# Patient Record
Sex: Female | Born: 1990 | Race: White | Hispanic: No | Marital: Single | Smoking: Current every day smoker
Health system: Southern US, Community
[De-identification: ages and names within clinical notes are randomized; demographics above are authoritative.]

## PROBLEM LIST (undated history)

## (undated) DIAGNOSIS — M51369 Other intervertebral disc degeneration, lumbar region without mention of lumbar back pain or lower extremity pain: Secondary | ICD-10-CM

## (undated) DIAGNOSIS — M5136 Other intervertebral disc degeneration, lumbar region: Secondary | ICD-10-CM

## (undated) DIAGNOSIS — K5190 Ulcerative colitis, unspecified, without complications: Secondary | ICD-10-CM

## (undated) DIAGNOSIS — E282 Polycystic ovarian syndrome: Secondary | ICD-10-CM

## (undated) HISTORY — DX: Polycystic ovarian syndrome: E28.2

## (undated) HISTORY — DX: Other intervertebral disc degeneration, lumbar region: M51.36

## (undated) HISTORY — PX: TONSILLECTOMY: SUR1361

## (undated) HISTORY — DX: Other intervertebral disc degeneration, lumbar region without mention of lumbar back pain or lower extremity pain: M51.369

---

## 2018-12-21 ENCOUNTER — Telehealth: Payer: Self-pay | Admitting: *Deleted

## 2018-12-21 ENCOUNTER — Other Ambulatory Visit: Payer: Self-pay | Admitting: Adult Health

## 2018-12-21 NOTE — Telephone Encounter (Signed)
Left message letting pt know we are not allowing any visitors or children at appt and if she has come in contact with someone or if she is experiencing any symptoms of Covid-19, please call office before coming to appt. [place]

## 2019-07-14 ENCOUNTER — Other Ambulatory Visit: Payer: Self-pay | Admitting: *Deleted

## 2019-07-14 DIAGNOSIS — Z20822 Contact with and (suspected) exposure to covid-19: Secondary | ICD-10-CM

## 2019-07-15 LAB — NOVEL CORONAVIRUS, NAA: SARS-CoV-2, NAA: NOT DETECTED

## 2019-07-18 ENCOUNTER — Telehealth: Payer: Self-pay | Admitting: *Deleted

## 2019-07-18 NOTE — Telephone Encounter (Signed)
Pt called in for COVID-19 test result.   Someone had called her earlier. I let her know her test result was non detected meaning she did not have the virus.

## 2019-09-30 ENCOUNTER — Other Ambulatory Visit: Payer: Self-pay

## 2019-09-30 ENCOUNTER — Emergency Department (HOSPITAL_COMMUNITY)
Admission: EM | Admit: 2019-09-30 | Discharge: 2019-09-30 | Disposition: A | Discharge status: Home / Self Care | Payer: Commercial Managed Care - PPO | Attending: Emergency Medicine | Admitting: Emergency Medicine

## 2019-09-30 ENCOUNTER — Emergency Department (HOSPITAL_COMMUNITY): Payer: Commercial Managed Care - PPO

## 2019-09-30 ENCOUNTER — Encounter (HOSPITAL_COMMUNITY): Payer: Self-pay | Admitting: Emergency Medicine

## 2019-09-30 DIAGNOSIS — F17210 Nicotine dependence, cigarettes, uncomplicated: Secondary | ICD-10-CM | POA: Insufficient documentation

## 2019-09-30 DIAGNOSIS — Y9389 Activity, other specified: Secondary | ICD-10-CM | POA: Diagnosis not present

## 2019-09-30 DIAGNOSIS — Y990 Civilian activity done for income or pay: Secondary | ICD-10-CM | POA: Diagnosis not present

## 2019-09-30 DIAGNOSIS — S39012A Strain of muscle, fascia and tendon of lower back, initial encounter: Secondary | ICD-10-CM | POA: Diagnosis not present

## 2019-09-30 DIAGNOSIS — S3992XA Unspecified injury of lower back, initial encounter: Secondary | ICD-10-CM | POA: Diagnosis present

## 2019-09-30 DIAGNOSIS — X501XXA Overexertion from prolonged static or awkward postures, initial encounter: Secondary | ICD-10-CM | POA: Diagnosis not present

## 2019-09-30 DIAGNOSIS — Y929 Unspecified place or not applicable: Secondary | ICD-10-CM | POA: Diagnosis not present

## 2019-09-30 HISTORY — DX: Ulcerative colitis, unspecified, without complications: K51.90

## 2019-09-30 LAB — URINALYSIS, ROUTINE W REFLEX MICROSCOPIC
Bilirubin Urine: NEGATIVE
Glucose, UA: NEGATIVE mg/dL
Hgb urine dipstick: NEGATIVE
Ketones, ur: NEGATIVE mg/dL
Leukocytes,Ua: NEGATIVE
Nitrite: NEGATIVE
Protein, ur: NEGATIVE mg/dL
Specific Gravity, Urine: 1.017 (ref 1.005–1.030)
pH: 5 (ref 5.0–8.0)

## 2019-09-30 LAB — POC URINE PREG, ED: Preg Test, Ur: NEGATIVE

## 2019-09-30 IMAGING — CR DG LUMBAR SPINE COMPLETE 4+V
5 series · 5 of 5 positions shown · non-contrast
Comparison: None.

CLINICAL DATA: Back injury, unable to bear weight in right leg

EXAM:
LUMBAR SPINE - COMPLETE 4+ VIEW

[l-spine ap]
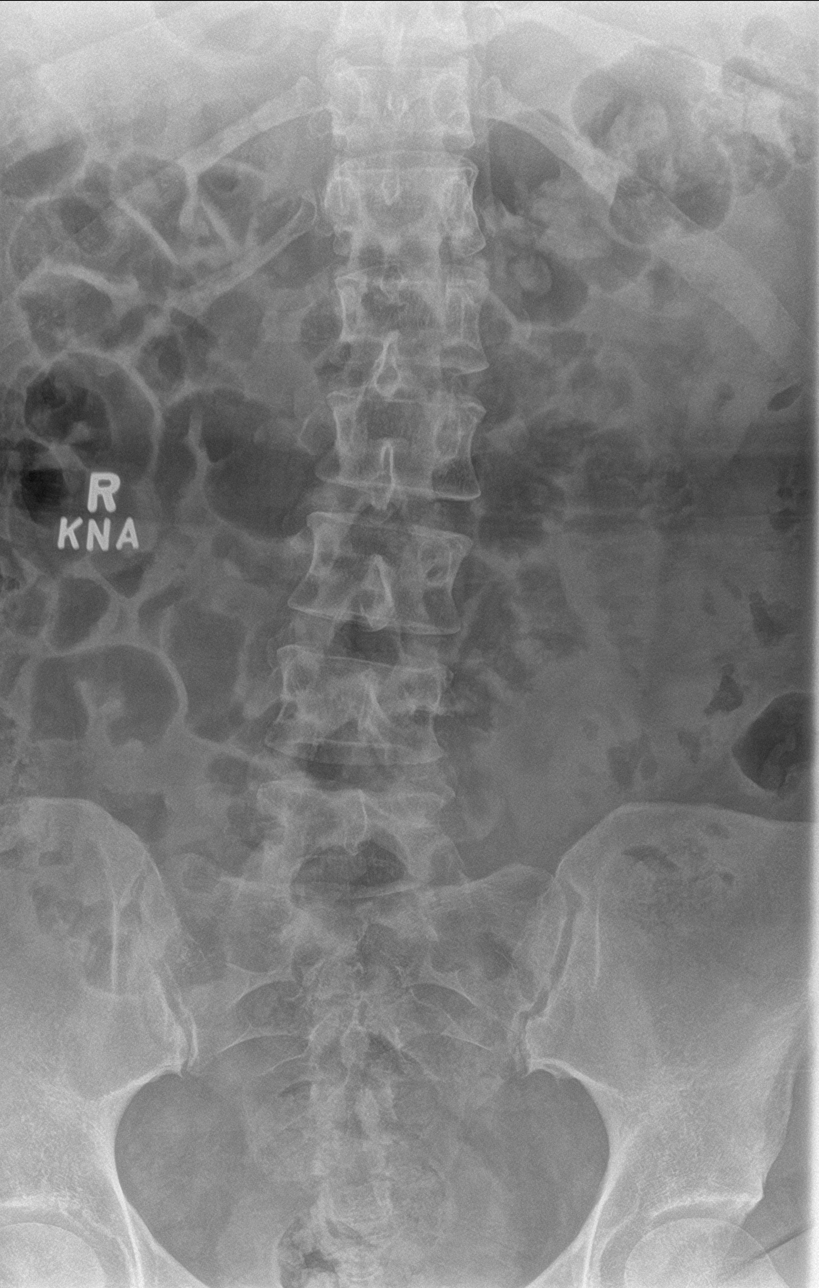

[l-spine obl (1 of 2)]
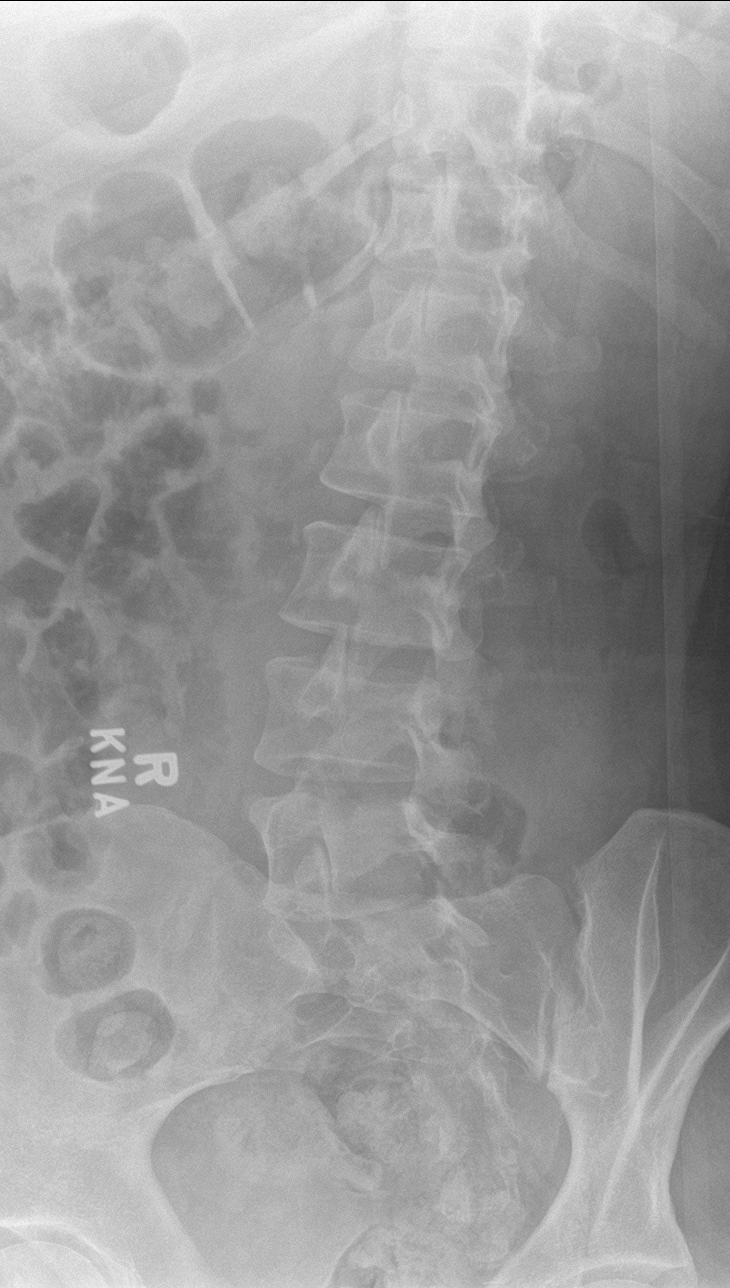

[l-spine obl (2 of 2)]
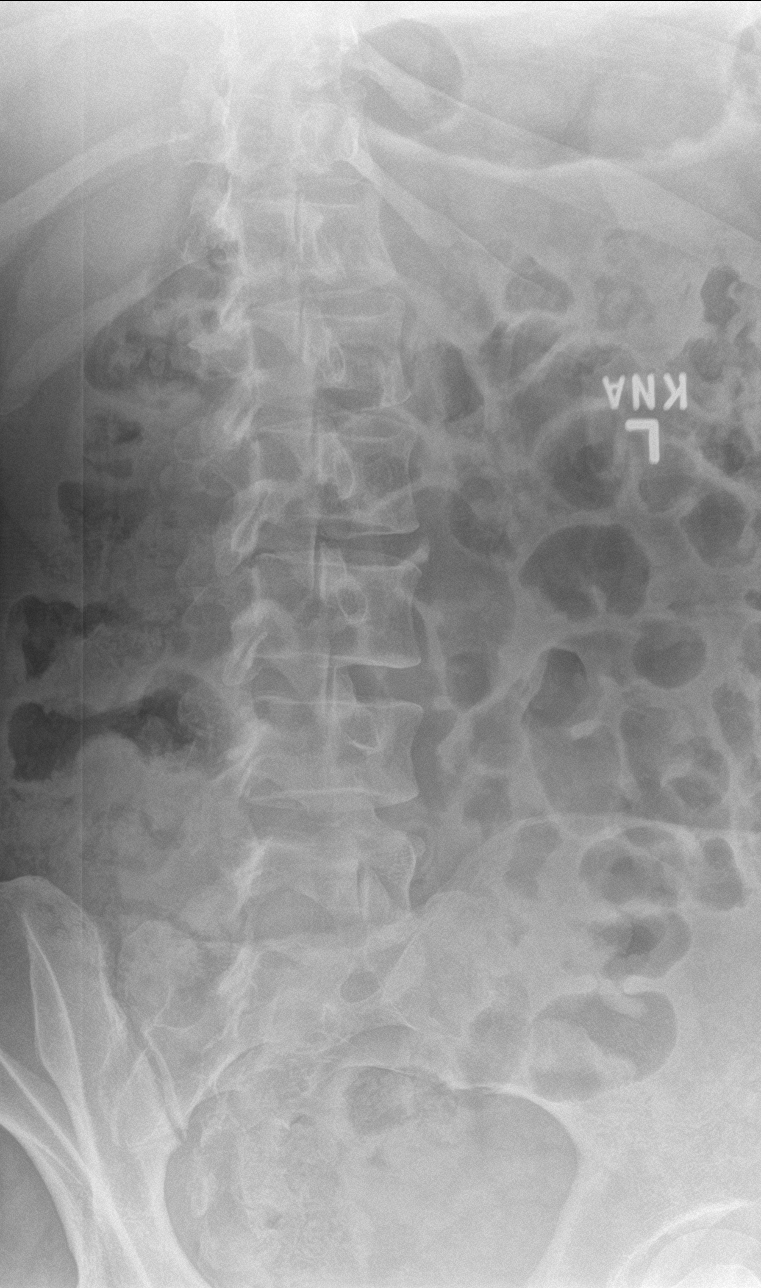

[l-spine lat]
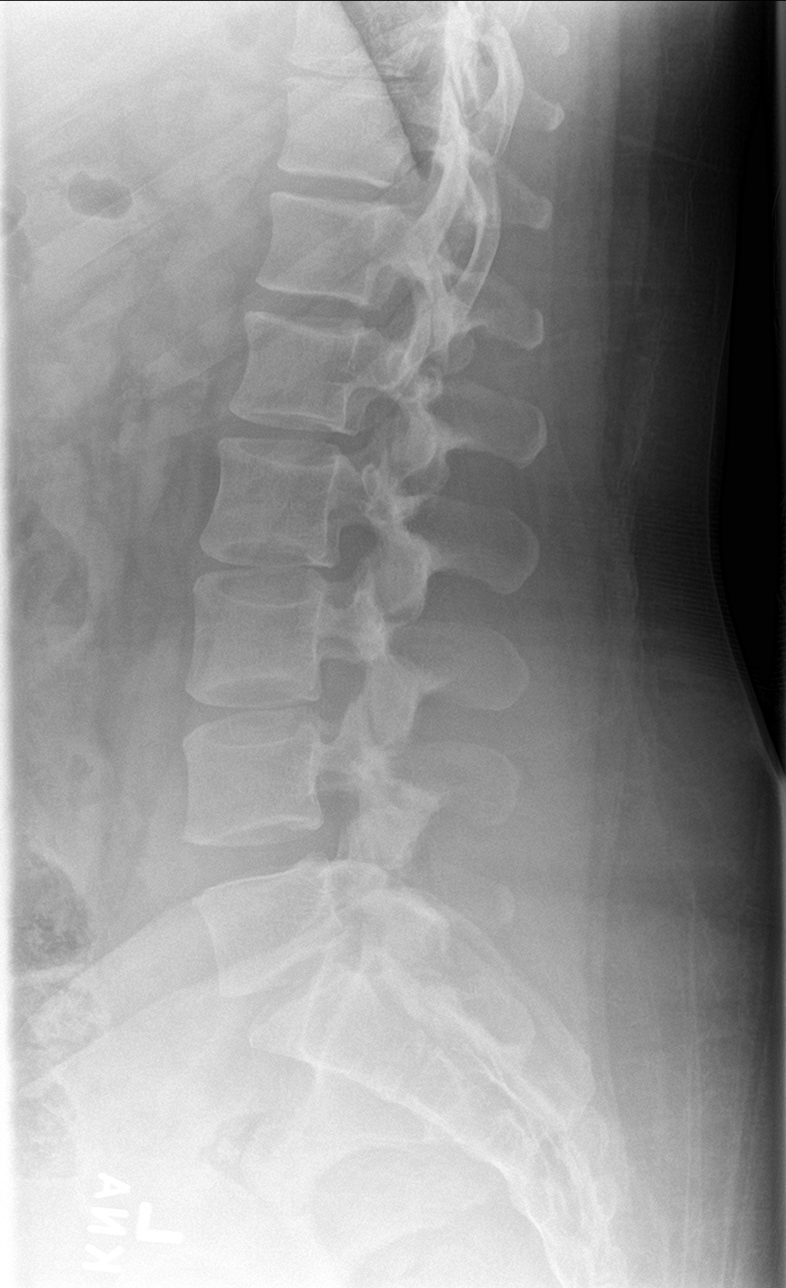

[l-spine spot]
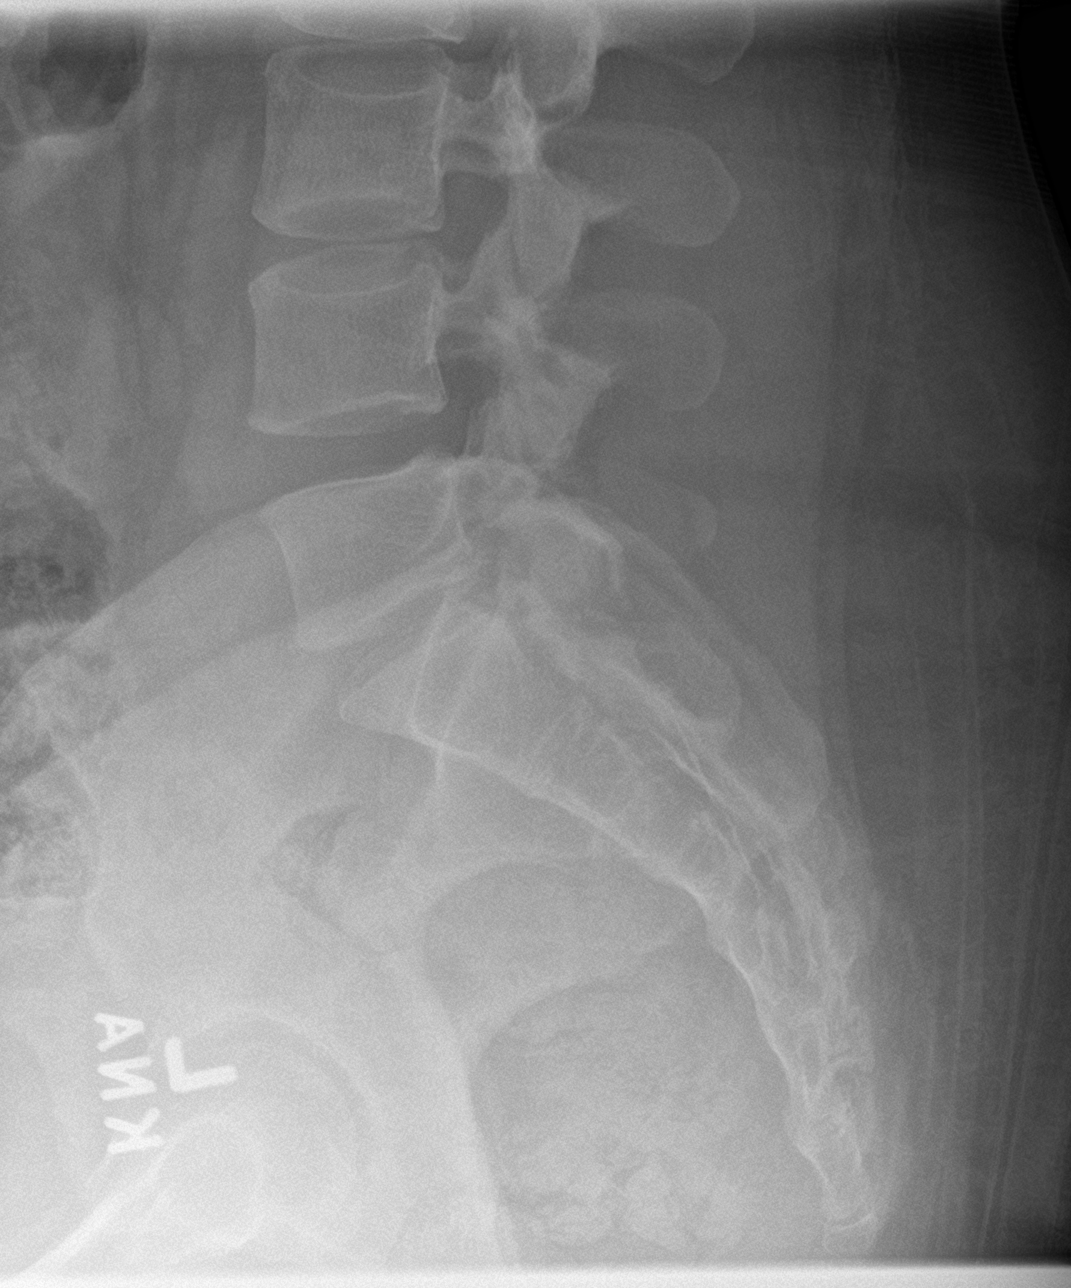

[5 of 5 positions shown; findings below may reference images not displayed]

FINDINGS: No fracture or dislocation of the lumbar spine. Disc spaces and
vertebral body heights are preserved. Gentle levoscoliosis of the
thoracolumbar spine, apex approximately L1-L2. Nonobstructive
pattern of overlying bowel gas.
IMPRESSION: No fracture or dislocation of the lumbar spine. Disc spaces and
vertebral body heights are preserved. Lumbar disc and neural
foraminal pathology may be further evaluated by MRI if indicated by
localizing signs and symptoms.

## 2019-09-30 MED ORDER — METHOCARBAMOL 500 MG PO TABS: 500.0000 mg | ORAL_TABLET | Freq: Two times a day (BID) | ORAL | 0 refills | Status: DC

## 2019-09-30 MED ORDER — OXYCODONE-ACETAMINOPHEN 5-325 MG PO TABS
1.0000 | ORAL_TABLET | Freq: Once | ORAL | Status: AC
  Administered 2019-09-30: 1 via ORAL
  Filled 2019-09-30: qty 1

## 2019-09-30 MED ORDER — DEXAMETHASONE SODIUM PHOSPHATE 10 MG/ML IJ SOLN: 8.0000 mg | Freq: Once | INTRAMUSCULAR | Status: DC

## 2019-09-30 MED ORDER — METHOCARBAMOL 500 MG PO TABS
500.0000 mg | ORAL_TABLET | Freq: Once | ORAL | Status: AC
  Administered 2019-09-30: 500 mg via ORAL
  Filled 2019-09-30: qty 1

## 2019-09-30 MED ORDER — DEXAMETHASONE SODIUM PHOSPHATE 10 MG/ML IJ SOLN
8.0000 mg | Freq: Once | INTRAMUSCULAR | Status: AC
  Administered 2019-09-30: 15:00:00 8 mg via INTRAMUSCULAR
  Filled 2019-09-30: qty 1

## 2019-09-30 MED ORDER — OXYCODONE-ACETAMINOPHEN 5-325 MG PO TABS: 1.0000 | ORAL_TABLET | Freq: Four times a day (QID) | ORAL | 0 refills | Status: DC | PRN

## 2019-09-30 NOTE — ED Triage Notes (Signed)
PA reported observing P:T ambulating

## 2019-09-30 NOTE — ED Provider Notes (Signed)
[facility] EMERGENCY DEPARTMENT Provider Note   CSN: [id number] Arrival date & time: 09/30/19  1232     History Chief Complaint  Patient presents with  . Back Pain    Kimberly Bautista is a 29 y.o. female with past medical history of ulcerative colitis, presenting to the emergency department with complaint of midline low back pain that began yesterday morning after work.  She states she believes it was because after transferring a patient.  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  Her pain is midline over the sacral region.  It is worse with twisting and certain movements as well as walking.  Sometimes twisting causes shooting pains down the back of her legs.  She denies numbness or weakness in her legs, bowel or bladder incontinence, saddle paresthesia, abdominal pain.  She is treated her symptoms with Tylenol.  She cannot take NSAIDs due to ulcerative colitis.  No previous back injuries.  The history is provided by the patient.       Past Medical History:  Diagnosis Date  . Ulcerative colitis ([place])     There are no problems to display for this patient.   History reviewed. No pertinent surgical history.   OB History   No obstetric history on file.     History reviewed. No pertinent family history.  Social History   Tobacco Use  . Smoking status: Current Every Day Smoker  . Smokeless tobacco: Never Used  Substance Use Topics  . Alcohol use: Yes  . Drug use: Never    Home Medications Prior to Admission medications   Medication Sig Start Date End Date Taking? Authorizing Provider  methocarbamol (ROBAXIN) 500 MG tablet Take 1 tablet (500 mg total) by mouth 2 (two) times daily. 09/30/19   Brynn Reznik, [place] N, PA-C  oxyCODONE-acetaminophen (PERCOCET/ROXICET) 5-325 MG tablet Take 1  tablet by mouth every 6 (six) hours as needed for severe pain. 09/30/19   Hattye Siegfried, [place] N, PA-C    Allergies    Penicillins  Review of Systems   Review of Systems  Gastrointestinal:       No bowel incontinence  Genitourinary: Negative for difficulty urinating.  Musculoskeletal: Positive for back pain.  Neurological: Negative for weakness and numbness.    Physical Exam Updated Vital Signs BP (!) 148/94 (BP Location: Right Arm)   Pulse 80   Temp 98.2 F (36.8 C) (Oral)   Resp 18   SpO2 99%   Physical Exam Vitals and nursing note reviewed.  Constitutional:      General: She is not in acute distress.    Appearance: She is well-developed. She is obese.  HENT:     Head: Normocephalic and atraumatic.  Eyes:     Conjunctiva/sclera: Conjunctivae normal.  Cardiovascular:     Rate and Rhythm: Normal rate and regular rhythm.  Pulmonary:     Effort: Pulmonary effort is normal. No respiratory distress.     Breath sounds: Normal breath sounds.  Musculoskeletal:     Comments: There is midline tenderness over the sacral region and extending to bilateral upper gluteal region.  No bony step-offs or gross deformities.  No tenderness to the L-spine.  Neurological:     Mental Status: She is alert.     Comments: Normal tone.  5/5 strength in BUE and BLE  including strong and equal grip strength and dorsiflexion/plantar flexion Sensory: Pinprick and light touch normal in BLE extremities.  Gait: normal gait and balance CV: distal pulses palpable throughout    Psychiatric:        Mood and Affect: Mood normal.        Behavior: Behavior normal.     ED Results / Procedures / Treatments   Labs (all labs ordered are listed, but only abnormal results are displayed) Labs Reviewed  URINALYSIS, ROUTINE W REFLEX MICROSCOPIC  POC URINE PREG, ED    EKG None  Radiology DG Lumbar Spine Complete  Result Date: 09/30/2019 CLINICAL DATA:  Back injury, unable to bear weight in right leg EXAM:  LUMBAR SPINE - COMPLETE 4+ VIEW COMPARISON:  None. FINDINGS: No fracture or dislocation of the lumbar spine. Disc spaces and vertebral body heights are preserved. Gentle levoscoliosis of the thoracolumbar spine, apex approximately L1-L2. Nonobstructive pattern of overlying bowel gas. IMPRESSION: No fracture or dislocation of the lumbar spine. Disc spaces and vertebral body heights are preserved. Lumbar disc and neural foraminal pathology may be further evaluated by MRI if indicated by localizing signs and symptoms. Electronically Signed   By: Eddie Candle M.D.   On: 09/30/2019 15:20    Procedures Procedures (including critical care time)  Medications Ordered in ED Medications  oxyCODONE-acetaminophen (PERCOCET/ROXICET) 5-325 MG per tablet 1 tablet (1 tablet Oral Given 09/30/19 1519)  methocarbamol (ROBAXIN) tablet 500 mg (500 mg Oral Given 09/30/19 1519)  dexamethasone (DECADRON) injection 8 mg (8 mg Intramuscular Given 09/30/19 1520)    ED Course  I have reviewed the triage vital signs and the nursing notes.  Pertinent labs & imaging results that were available during my care of the patient were reviewed by me and considered in my medical decision making (see chart for details).    MDM Rules/Calculators/A&P                      Patient with back pain after moving a patient at work the night before last, likely muscle strain.  Pain is located in the sacral region with some radiation towards the gluteal region.  Symptoms are worse with movement and walking.  No red flags or concerning symptoms of cauda equina or significant nerve impingement.  Normal neuro exam.  Patient can walk but states it is painful, worse in the right leg.  L-spine x-ray is negative with preserved disc height spaces.  Patient treated in the ED with pain medication, muscle relaxer and Decadron.  Discussed symptomatic management and outpatient follow-up if symptoms persist.  Strict return precautions discussed.  Patient is  agreeable to plan and safe for discharge.  Discussed results, findings, treatment and follow up. Patient advised of return precautions. Patient verbalized understanding and agreed with plan.  [place] Controlled Substance reporting System queried  Final Clinical Impression(s) / ED Diagnoses Final diagnoses:  Lumbosacral strain, initial encounter    Rx / DC Orders ED Discharge Orders         Ordered    oxyCODONE-acetaminophen (PERCOCET/ROXICET) 5-325 MG tablet  Every 6 hours PRN     09/30/19 1550    methocarbamol (ROBAXIN) 500 MG tablet  2 times daily     09/30/19 [place], [place] N, PA-C 09/30/19 1607    Lennice Sites, DO 10/01/19 0703

## 2019-09-30 NOTE — Discharge Instructions (Signed)
Please read instructions below.  You can take oxycodone every 6 hours as needed for severe pain.   Apply ice to your back for 20 minutes at a time.  You can also apply heat if this provides more relief.   You can take robaxin every 12 hours as needed for muscle spasm.  Be aware this medication can make you drowsy; do not take while driving or drinking alcohol.   Follow-up with your primary care provider.   Return to ER if new numbness or weakness in your legs, inability to urinate, inability to hold your bowels, or severely worsening symptoms.

## 2019-09-30 NOTE — ED Notes (Signed)
Patient transported to X-ray 

## 2019-09-30 NOTE — ED Triage Notes (Signed)
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

## 2019-09-30 NOTE — ED Notes (Signed)
Patient verbalizes understanding of discharge instructions . Opportunity for questions and answers were provided . Armband removed by staff ,Pt discharged from ED. W/C  offered at D/C  and Declined W/C at D/C and was escorted to lobby by RN.  

## 2020-04-10 ENCOUNTER — Other Ambulatory Visit: Payer: Self-pay

## 2020-04-10 ENCOUNTER — Encounter: Payer: Self-pay | Admitting: Family Medicine

## 2020-04-10 ENCOUNTER — Ambulatory Visit (INDEPENDENT_AMBULATORY_CARE_PROVIDER_SITE_OTHER): Payer: Commercial Managed Care - PPO | Admitting: Family Medicine

## 2020-04-10 DIAGNOSIS — G8929 Other chronic pain: Secondary | ICD-10-CM

## 2020-04-10 DIAGNOSIS — M545 Low back pain: Secondary | ICD-10-CM

## 2020-04-10 MED ORDER — NABUMETONE 750 MG PO TABS: 750.0000 mg | ORAL_TABLET | Freq: Two times a day (BID) | ORAL | 6 refills | Status: DC | PRN

## 2020-04-10 MED ORDER — TIZANIDINE HCL 2 MG PO TABS: 2.0000 mg | ORAL_TABLET | Freq: Four times a day (QID) | ORAL | 1 refills | Status: DC | PRN

## 2020-04-10 NOTE — Progress Notes (Signed)
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

## 2020-04-10 NOTE — Progress Notes (Signed)
   Office Visit Note   Patient: Kimberly Bautista           Date of Birth: 1991-03-29           MRN: [id number] Visit Date: 04/10/2020 Requested by: Toma Aran, PA-C [place],  [place] [id number] PCP: Toma Aran, PA-C  Subjective: Chief Complaint  Patient presents with  . Lower Back - Pain    HPI: Pain.  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  Around that time she began feeling midline lumbosacral pain with occasional radiation into her legs.  She went to the ER where x-rays were negative for acute abnormality.  She was given some medications but her pain has persisted.  Denies any bowel or bladder dysfunction..  No fevers or chills.  Pain is better when standing, worse when sitting or bending and worse when lying on her side.  Her mother has a history of lumbar disc problems.  There is no family history of rheumatologic disease.              ROS:   All other systems were reviewed and are negative.  Objective: Vital Signs: There were no vitals taken for this visit.  Physical Exam:  General:  Alert and oriented, in no acute distress. Pulm:  Breathing unlabored. Psy:  Normal mood, congruent affect. Skin: No visible rash. Low back: She is tender to palpation of both SI joints.  No other spine tenderness.  No scoliosis.  Limited active range of motion.  No pain in the sciatic notch.  Negative bilateral straight leg raise.  Lower extremity strength and reflexes are normal.  Patrick's test is equivocal.  Imaging: No results found.  Assessment & Plan: 1.  Chronic low back pain with radicular symptoms, possibly SI dysfunction vs HNP - Try PT and chiro. - Meloxicam and zanaflex. - MRI if fails to improve.     Procedures: No procedures performed  No notes on file     PMFS History: There are no problems to display for this patient.  Past Medical History:  Diagnosis Date  . Ulcerative  colitis ([place])     History reviewed. No pertinent family history.  History reviewed. No pertinent surgical history. Social History   Occupational History  . Not on file  Tobacco Use  . Smoking status: Current Every Day Smoker  . Smokeless tobacco: Never Used  Substance and Sexual Activity  . Alcohol use: Yes  . Drug use: Never  . Sexual activity: Not on file

## 2020-04-15 ENCOUNTER — Encounter: Payer: Self-pay | Admitting: Family Medicine

## 2020-04-25 ENCOUNTER — Telehealth: Payer: Self-pay

## 2020-04-25 NOTE — Telephone Encounter (Signed)
Katie with [facility] would like Rx, demographics, and last office visit faxed to [phone] attn:Katie.  Cb# [phone].  Please advise.  Thank you.

## 2020-04-26 ENCOUNTER — Telehealth: Payer: Self-pay

## 2020-04-26 NOTE — Telephone Encounter (Signed)
Faxed

## 2020-04-26 NOTE — Telephone Encounter (Signed)
[facility] would like referral for physical therapy to be faxed to [phone].  CB# [phone].  Please advise.  Thank you.

## 2020-04-26 NOTE — Telephone Encounter (Signed)
Kimberly Bautista will be faxing this shortly.

## 2020-05-29 ENCOUNTER — Telehealth: Payer: Self-pay | Admitting: Family Medicine

## 2020-05-29 NOTE — Telephone Encounter (Signed)
Called patient left message on voicemail to return call to schedule an appointment wit Dr Junius Roads  To followup back injury per [facility] request

## 2020-06-10 ENCOUNTER — Encounter: Payer: Self-pay | Admitting: Family Medicine

## 2020-06-10 ENCOUNTER — Ambulatory Visit (INDEPENDENT_AMBULATORY_CARE_PROVIDER_SITE_OTHER): Payer: Commercial Managed Care - PPO | Admitting: Family Medicine

## 2020-06-10 ENCOUNTER — Other Ambulatory Visit: Payer: Self-pay

## 2020-06-10 DIAGNOSIS — M545 Low back pain, unspecified: Secondary | ICD-10-CM

## 2020-06-10 DIAGNOSIS — G8929 Other chronic pain: Secondary | ICD-10-CM

## 2020-06-10 NOTE — Progress Notes (Signed)
   Office Visit Note   Patient: Kimberly Bautista           Date of Birth: 20-Jun-1991           MRN: [id number] Visit Date: 06/10/2020 Requested by: Toma Aran, PA-C [place],  [place] [id number] PCP: Toma Aran, PA-C  Subjective: Chief Complaint  Patient presents with  . 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 Patient states that since our last encounter, she has been compliant with physical therapy, medications, and chiropractic care, with minimal improvement. She says that the chiropractor was very uncomfortable, so she only went a few times before stopping that, however she has been very good with PT. Unfortunately, she feels she has hit a plateau with PT, and remains unable to chest compressions at work, due to significant pain with forward flexion. She has no radicular pain today, but continues to endorse intermittent right sided radiculopathy from time-to-time.  No bowel/bladder dysfunction. She is otherwise doing well.               ROS:   All other systems were reviewed and are negative.  Objective: Vital Signs: There were no vitals taken for this visit.  Physical Exam:  General:  Alert and oriented, in no acute distress. Pulm:  Breathing unlabored. Psy:  Normal mood, congruent affect. Skin:  No bruises or rashes appreciated.   BACK EXAM:  Reduced lumbar lordosis. Normal gait.  Reduced ROM with forward flexion, approximately 16 in from toe-touch due to pain. Reduced ROM in extension. Side-bending and rotation preserved.  TTP Along R SI Joint, and R lumbar paraspinal muscles.  5/5 strength throughout bilateral lower leg musculature.  2+ patellar reflexes bilaterally.  Sensation intact to light touch bilaterally.   Imaging: No results found.  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 No significant improvement with conservative measures thus far (meds, PT, chiro). Will order MRI to further evaluate for underlying disc or bony pathology.  -Patient is encouraged to continue her HEP as given by PT while awaiting MRI results.  -Will contact pending MRI results -Patient with no further questions or concerns at this time.      Procedures: No procedures performed  No notes on file     PMFS History: There are no problems to display for this patient.  Past Medical History:  Diagnosis Date  . Ulcerative colitis ([place])     History reviewed. No pertinent family history.  History reviewed. No pertinent surgical history. Social History   Occupational History  . Not on file  Tobacco Use  . Smoking status: Current Every Day Smoker  . Smokeless tobacco: Never Used  Substance and Sexual Activity  . Alcohol use: Yes  . Drug use: Never  . Sexual activity: Not on file

## 2020-06-10 NOTE — Progress Notes (Signed)
I saw and examined the patient with Dr. Elouise Munroe and agree with assessment and plan as outlined.    Ongoing right-sided LBP with sciatica despite adequate trial of PT.  Will request MRI scan.

## 2020-06-10 NOTE — Progress Notes (Signed)
Felling a little better

## 2020-06-30 ENCOUNTER — Other Ambulatory Visit: Payer: Commercial Managed Care - PPO

## 2020-07-26 ENCOUNTER — Telehealth: Payer: Self-pay | Admitting: Family Medicine

## 2020-07-26 ENCOUNTER — Ambulatory Visit
Admission: RE | Admit: 2020-07-26 | Discharge: 2020-07-26 | Disposition: A | Discharge status: Home / Self Care | Payer: Commercial Managed Care - PPO | Source: Ambulatory Visit | Attending: Family Medicine | Admitting: Family Medicine

## 2020-07-26 DIAGNOSIS — G8929 Other chronic pain: Secondary | ICD-10-CM

## 2020-07-26 IMAGING — MR MR LUMBAR SPINE W/O CM
4 of 5 series · 19 of 48 positions shown · non-contrast
Comparison: None.

CLINICAL DATA: Lumbar radiculopathy; technologist note states low
back pain radiating into right leg

EXAM:
MRI LUMBAR SPINE WITHOUT CONTRAST
TECHNIQUE: Multiplanar, multisequence MR imaging of the lumbar spine was
performed. No intravenous contrast was administered.

[Series 5: T2 · sagittal · 4.0mm · 0.73mm/px · 6 of 17 slices shown (1 of 2)]
[im 1/17]
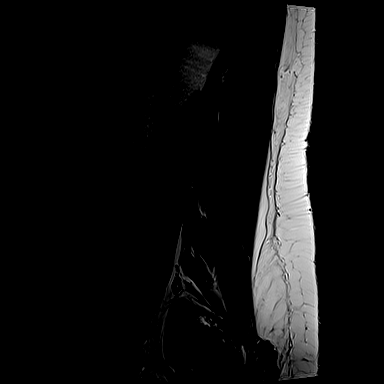
[im 4/17]
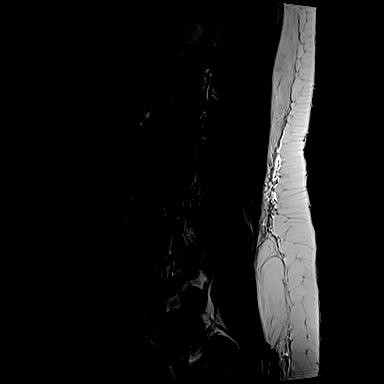
[im 7/17]
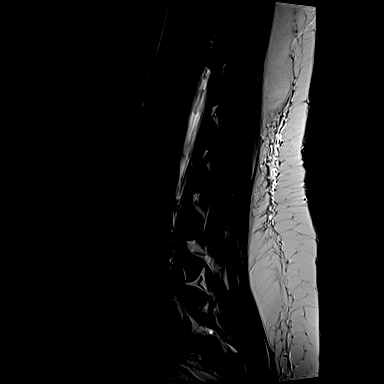
[im 10/17]
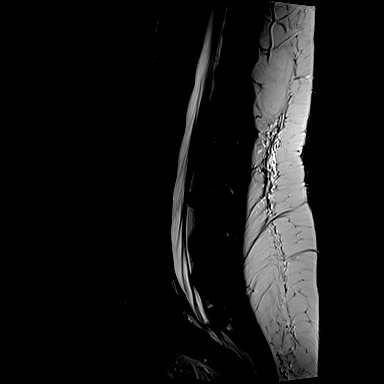
[im 13/17]
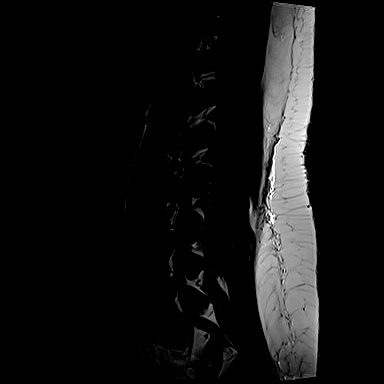
[im 17/17]
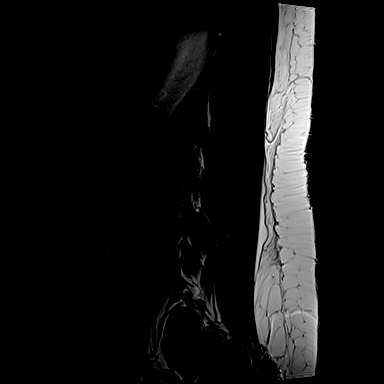

[Series 6: T1 · sagittal · 4.0mm · 0.73mm/px · 3 of 17 slices shown (1 of 2)]
[im 4/17]
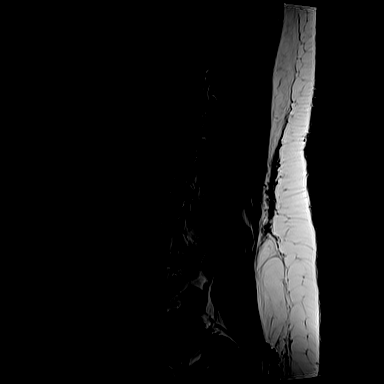
[im 10/17]
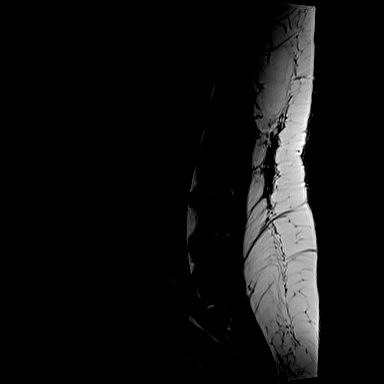
[im 17/17]
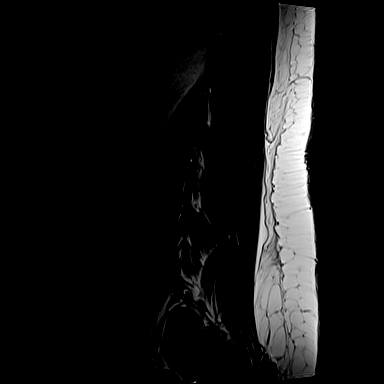

[Series 10: T1 · axial · 4.0mm · 0.28mm/px · z∈[-127,+37]mm · 3 of 40 slices shown (2 of 2)]
[im 6/40]
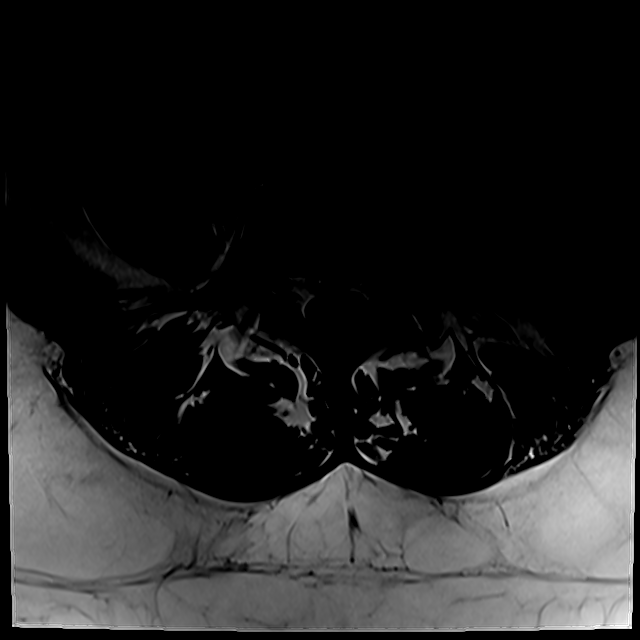
[im 20/40]
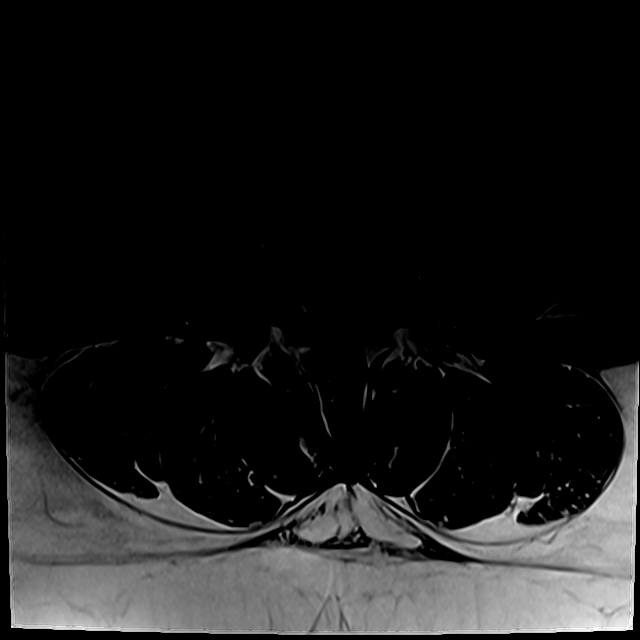
[im 34/40]
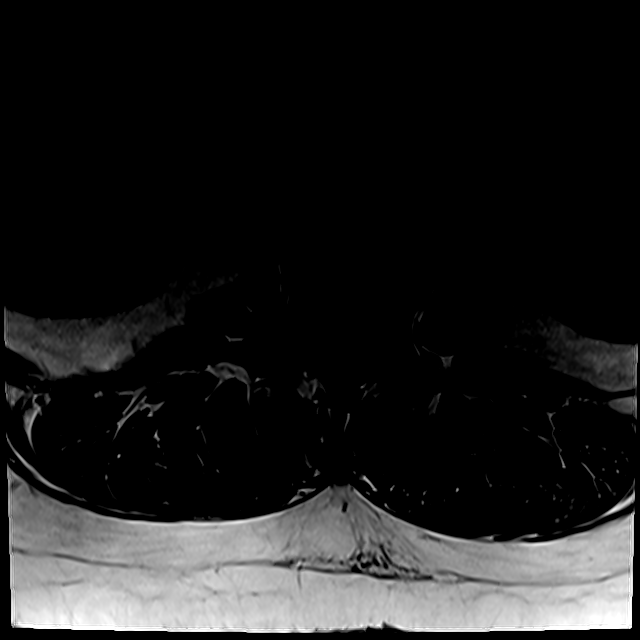

[Series 13: T2 · axial · 4.0mm · 0.28mm/px · z∈[-152,+37]mm · 7 of 40 slices shown (2 of 2)]
[im 1/40]
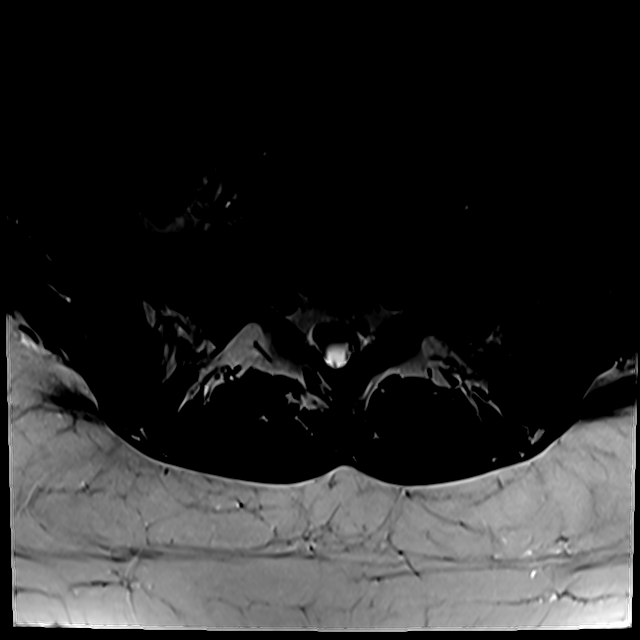
[im 6/40]
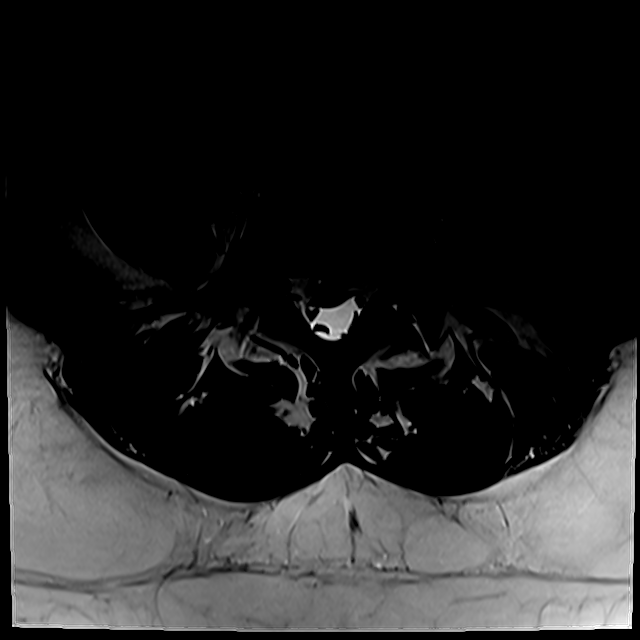
[im 12/40]
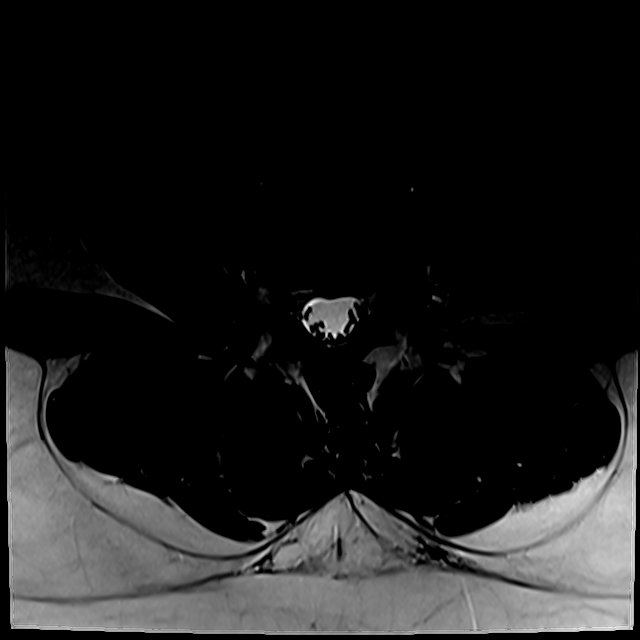
[im 17/40]
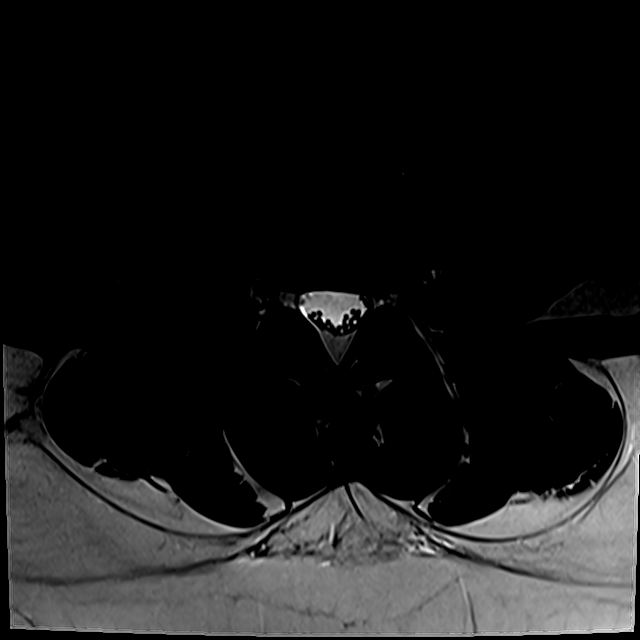
[im 20/40]
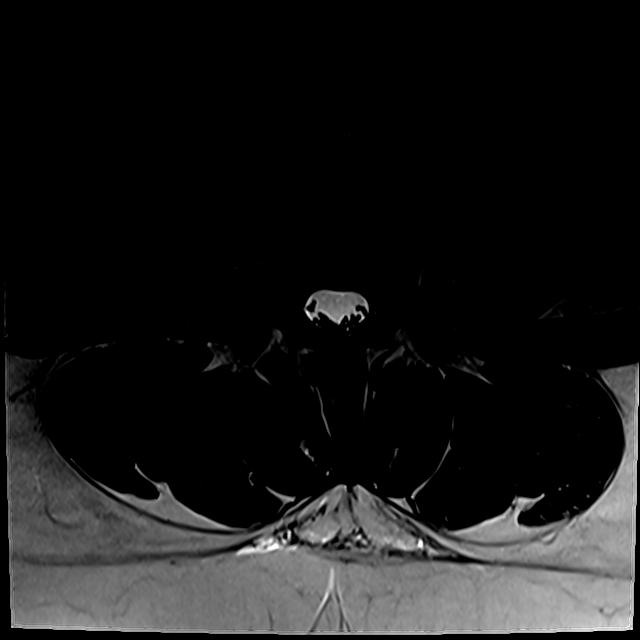
[im 23/40]
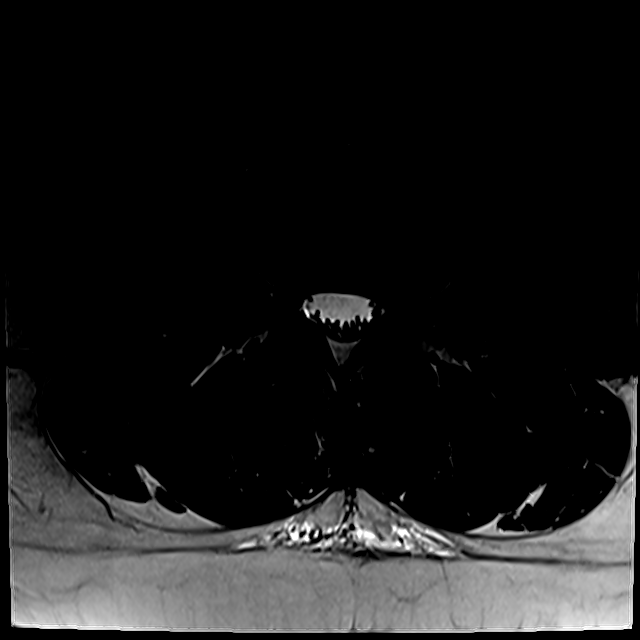
[im 34/40]
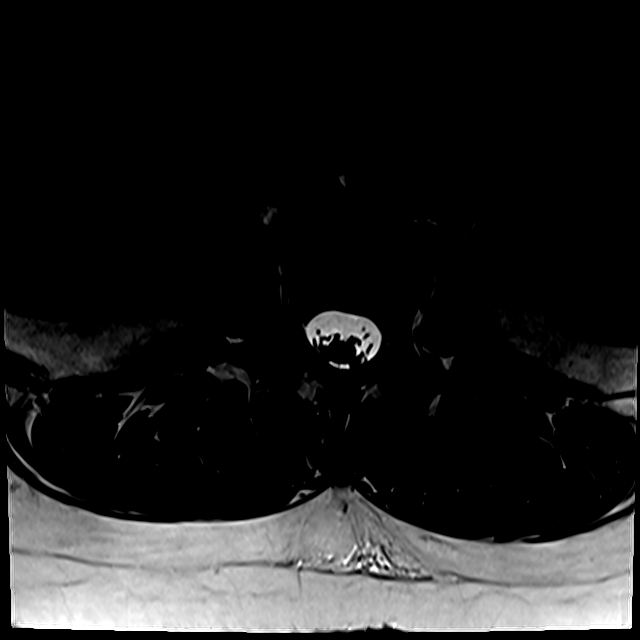

[19 of 48 positions shown; findings below may reference images not displayed]

FINDINGS: Segmentation:  Standard.

Alignment:  Anteroposterior alignment is maintained.

Vertebrae: Vertebral body heights are maintained. No significant
marrow edema. No suspicious osseous lesion.

Conus medullaris and cauda equina: Conus extends to the L2 level.
Conus and cauda equina appear normal.

Paraspinal and other soft tissues: Unremarkable.

Disc levels:

L1-L2:  No stenosis.

L2-L3:  No stenosis.

L3-L4:  No stenosis.

L4-L5:  Disc desiccation.  Minimal disc bulge.  No stenosis.

L5-S1: Disc desiccation and mild height loss. Central disc
protrusion flattening the ventral thecal sac. No canal stenosis.
Disc contacts the traversing right S1 nerve root. No foraminal
stenosis.
IMPRESSION: Lower lumbar degenerative changes. Most notably, a disc protrusion
at L5-S1 contacts the traversing right S1 nerve root and may account
for reported radiculopathy.

## 2020-07-26 NOTE — Addendum Note (Signed)
Addended by: Hortencia Pilar on: 07/26/2020 11:33 AM   Modules accepted: Orders

## 2020-07-26 NOTE — Telephone Encounter (Signed)
Lumbar MRI scan shows a disc protrusion at L5-S1 which contacts the right S1 nerve root.  This is probably the cause of pain.

## 2020-07-31 ENCOUNTER — Telehealth: Payer: Self-pay | Admitting: Physical Medicine and Rehabilitation

## 2020-07-31 NOTE — Telephone Encounter (Signed)
Patient returned call to [facility]. Please call patient at [phone].

## 2020-07-31 NOTE — Telephone Encounter (Signed)
See referral

## 2020-08-16 ENCOUNTER — Other Ambulatory Visit: Payer: Self-pay

## 2020-08-16 DIAGNOSIS — Z7984 Long term (current) use of oral hypoglycemic drugs: Secondary | ICD-10-CM | POA: Diagnosis not present

## 2020-08-16 DIAGNOSIS — F17200 Nicotine dependence, unspecified, uncomplicated: Secondary | ICD-10-CM | POA: Diagnosis not present

## 2020-08-16 DIAGNOSIS — R112 Nausea with vomiting, unspecified: Secondary | ICD-10-CM | POA: Diagnosis not present

## 2020-08-16 DIAGNOSIS — R1031 Right lower quadrant pain: Secondary | ICD-10-CM | POA: Insufficient documentation

## 2020-08-17 ENCOUNTER — Emergency Department (HOSPITAL_COMMUNITY): Payer: Commercial Managed Care - PPO

## 2020-08-17 ENCOUNTER — Other Ambulatory Visit: Payer: Self-pay

## 2020-08-17 ENCOUNTER — Encounter (HOSPITAL_COMMUNITY): Payer: Self-pay | Admitting: Emergency Medicine

## 2020-08-17 ENCOUNTER — Emergency Department (HOSPITAL_COMMUNITY)
Admission: EM | Admit: 2020-08-17 | Discharge: 2020-08-17 | Disposition: A | Discharge status: Home / Self Care | Payer: Commercial Managed Care - PPO | Attending: Emergency Medicine | Admitting: Emergency Medicine

## 2020-08-17 DIAGNOSIS — R1031 Right lower quadrant pain: Secondary | ICD-10-CM

## 2020-08-17 DIAGNOSIS — K2980 Duodenitis without bleeding: Secondary | ICD-10-CM

## 2020-08-17 DIAGNOSIS — R112 Nausea with vomiting, unspecified: Secondary | ICD-10-CM

## 2020-08-17 LAB — CBC WITH DIFFERENTIAL/PLATELET
Abs Immature Granulocytes: 0.05 10*3/uL (ref 0.00–0.07)
Basophils Absolute: 0.1 10*3/uL (ref 0.0–0.1)
Basophils Relative: 1 %
Eosinophils Absolute: 0.3 10*3/uL (ref 0.0–0.5)
Eosinophils Relative: 3 %
HCT: 44.1 % (ref 36.0–46.0)
Hemoglobin: 14.4 g/dL (ref 12.0–15.0)
Immature Granulocytes: 1 %
Lymphocytes Relative: 20 %
Lymphs Abs: 2.1 10*3/uL (ref 0.7–4.0)
MCH: 30.9 pg (ref 26.0–34.0)
MCHC: 32.7 g/dL (ref 30.0–36.0)
MCV: 94.6 fL (ref 80.0–100.0)
Monocytes Absolute: 0.5 10*3/uL (ref 0.1–1.0)
Monocytes Relative: 5 %
Neutro Abs: 7.2 10*3/uL (ref 1.7–7.7)
Neutrophils Relative %: 70 %
Platelets: 284 10*3/uL (ref 150–400)
RBC: 4.66 MIL/uL (ref 3.87–5.11)
RDW: 12.5 % (ref 11.5–15.5)
WBC: 10.1 10*3/uL (ref 4.0–10.5)
nRBC: 0 % (ref 0.0–0.2)

## 2020-08-17 LAB — COMPREHENSIVE METABOLIC PANEL
ALT: 31 U/L (ref 0–44)
AST: 18 U/L (ref 15–41)
Albumin: 3.9 g/dL (ref 3.5–5.0)
Alkaline Phosphatase: 51 U/L (ref 38–126)
Anion gap: 7 (ref 5–15)
BUN: 11 mg/dL (ref 6–20)
CO2: 27 mmol/L (ref 22–32)
Calcium: 8.6 mg/dL — ABNORMAL LOW (ref 8.9–10.3)
Chloride: 105 mmol/L (ref 98–111)
Creatinine, Ser: 0.78 mg/dL (ref 0.44–1.00)
GFR, Estimated: >60 mL/min (ref >60)
Glucose, Bld: 98 mg/dL (ref 70–99)
Potassium: 4.1 mmol/L (ref 3.5–5.1)
Sodium: 139 mmol/L (ref 135–145)
Total Bilirubin: 0.6 mg/dL (ref 0.3–1.2)
Total Protein: 6.9 g/dL (ref 6.5–8.1)

## 2020-08-17 LAB — LIPASE, BLOOD: Lipase: 24 U/L (ref 11–51)

## 2020-08-17 IMAGING — CT CT ABD-PELV W/ CM
2 of 4 series · 16 of 46 positions shown, 18 images · IV contrast (omnipaque)
Comparison: [DATE] CT

CLINICAL DATA: 29-year-old female with RIGHT abdominal and pelvic
pain for 4 days.

EXAM:
CT ABDOMEN AND PELVIS WITH CONTRAST
TECHNIQUE: Multidetector CT imaging of the abdomen and pelvis was performed
using the standard protocol following bolus administration of
intravenous contrast.
CONTRAST:  100mL OMNIPAQUE IOHEXOL 300 MG/ML  SOLN

[Series 2: axial st · axial · 0.80mm/px · z∈[+727,+1252]mm · 13 of 115 slices shown, 15 images]
[im 5/115  soft-tissue]
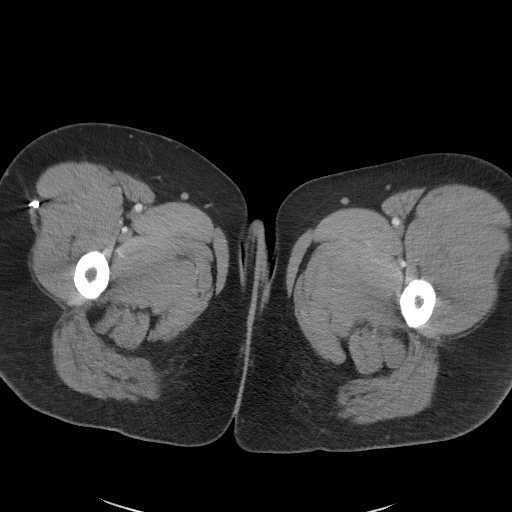
[im 5/115  bone]
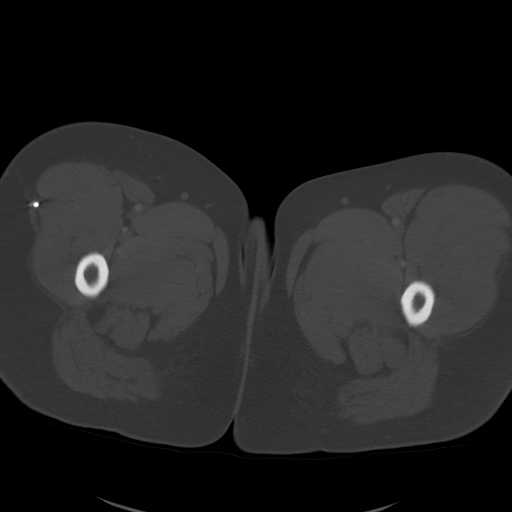
[im 14/115  soft-tissue]
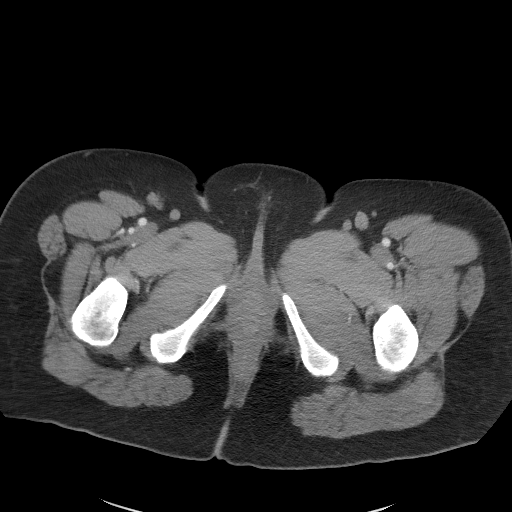
[im 23/115  soft-tissue]
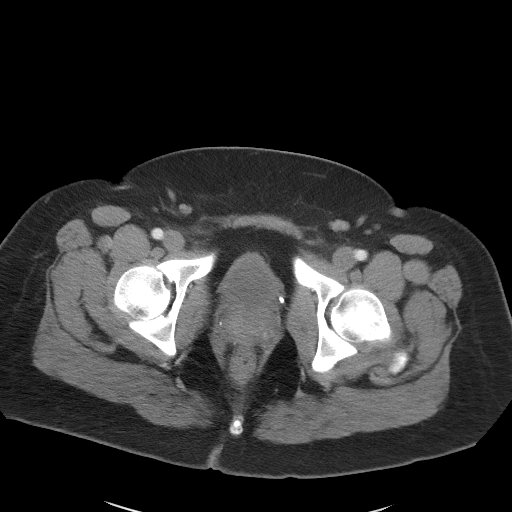
[im 32/115  soft-tissue]
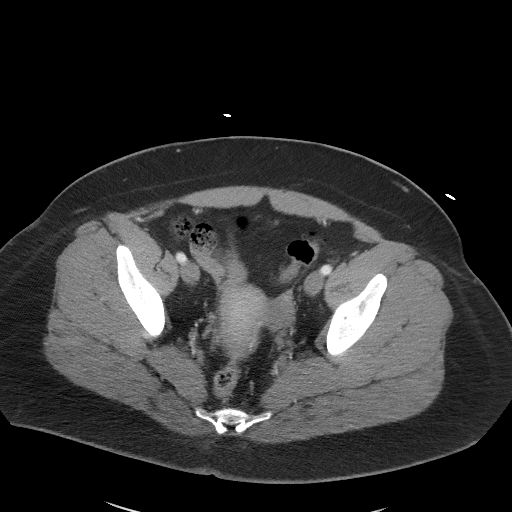
[im 42/115  soft-tissue]
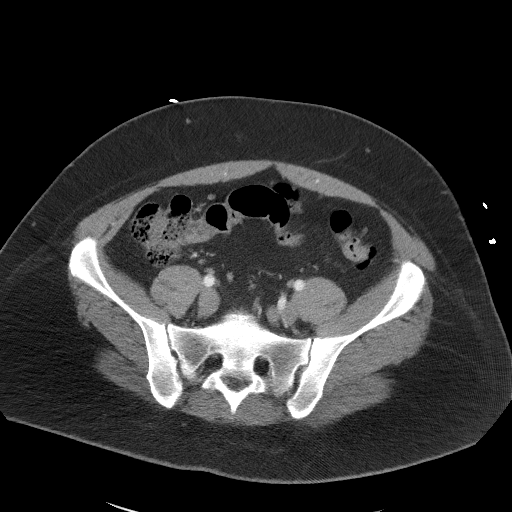
[im 51/115  soft-tissue]
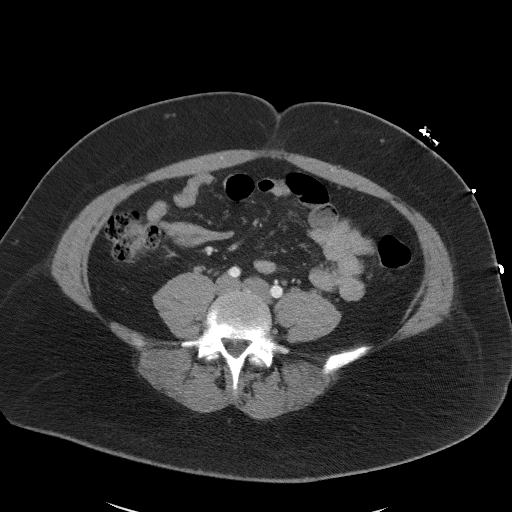
[im 60/115  soft-tissue]
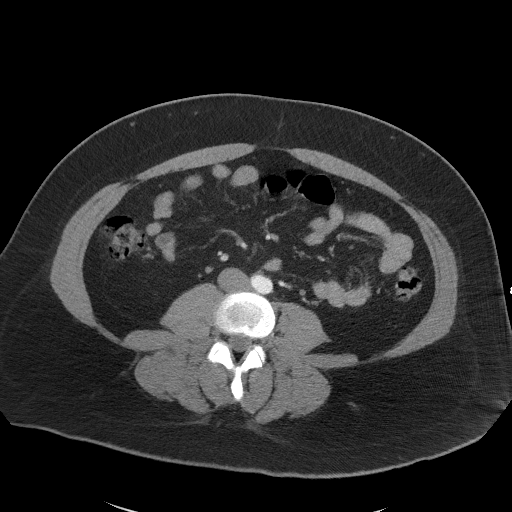
[im 64/115  soft-tissue]
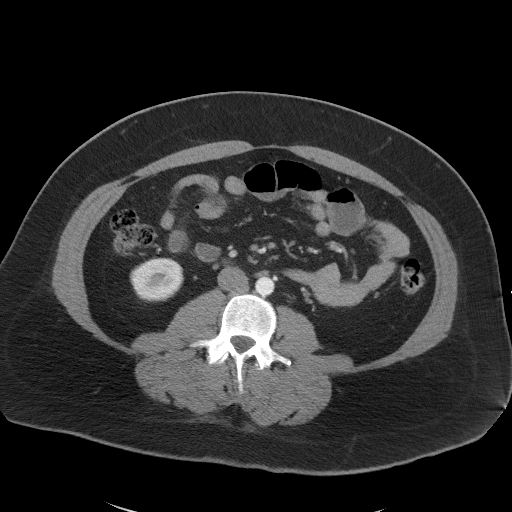
[im 73/115  soft-tissue]
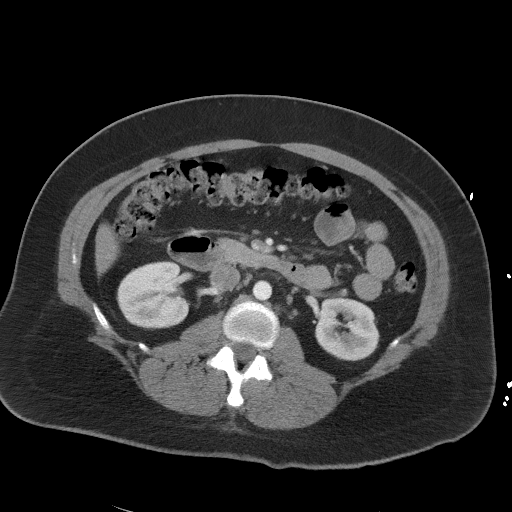
[im 73/115  bone]
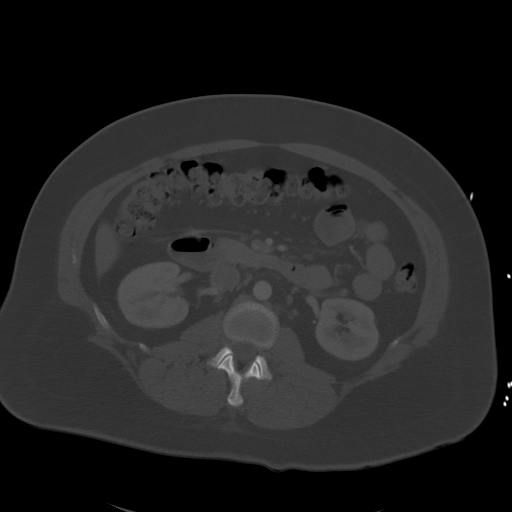
[im 83/115  soft-tissue]
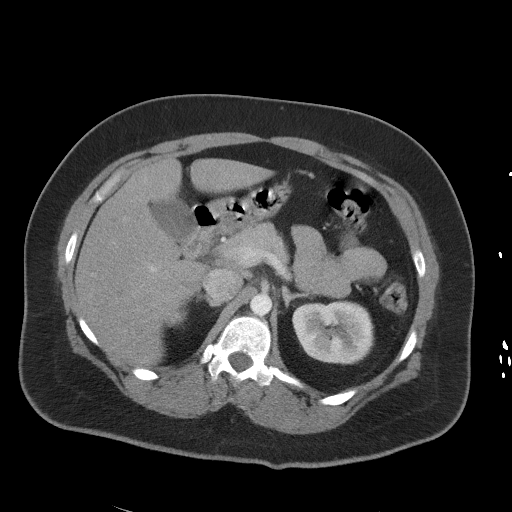
[im 92/115  soft-tissue]
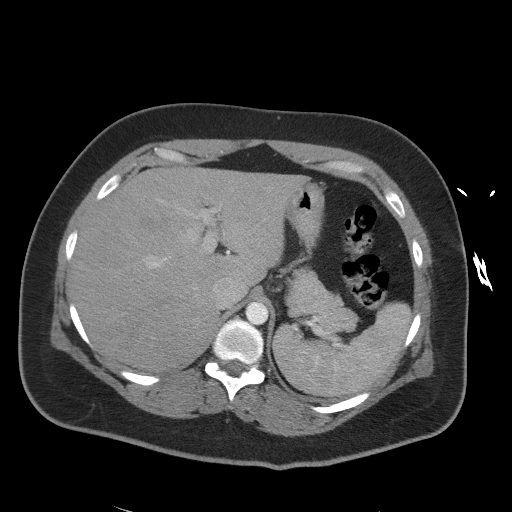
[im 101/115  soft-tissue]
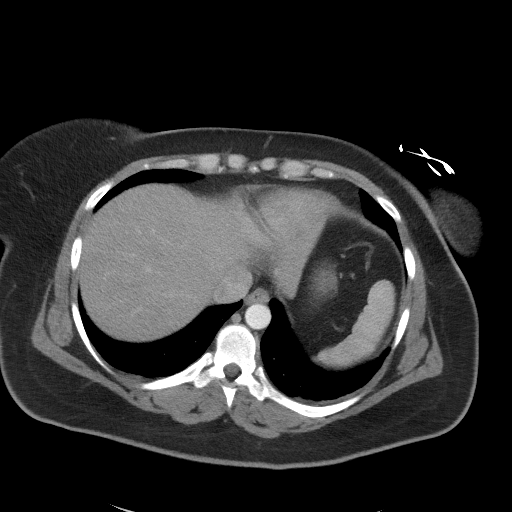
[im 110/115  soft-tissue]
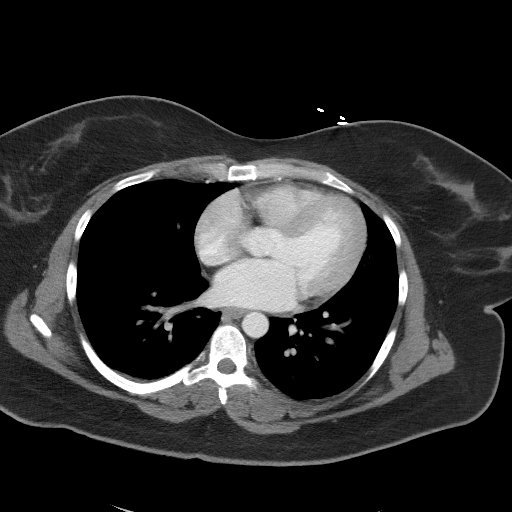

[Series 5: coronal st · coronal · 0.84mm/px · 3 of 99 slices shown]
[im 33/99  soft-tissue]
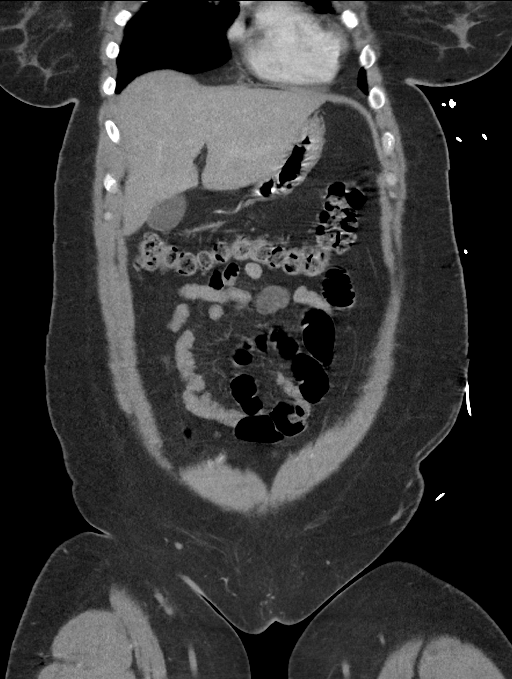
[im 44/99  soft-tissue]
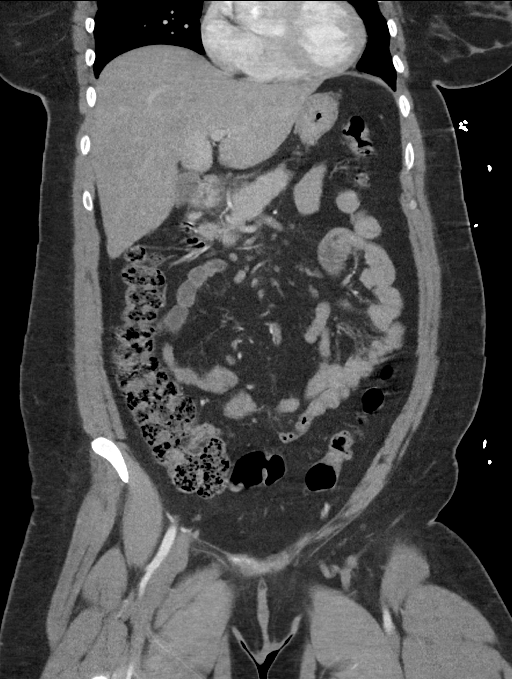
[im 55/99  soft-tissue]
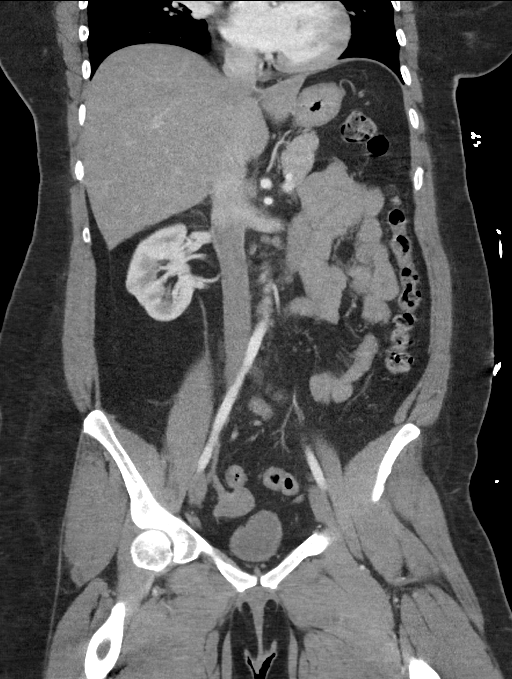

[16 of 46 positions shown; findings below may reference images not displayed]

FINDINGS: Lower chest: No acute abnormality.

Hepatobiliary: No significant liver or gallbladder abnormalities
identified.

Pancreas: Unremarkable

Spleen: Unremarkable

Adrenals/Urinary Tract: An indeterminate 1 cm mass within the
LATERAL aspect of the RIGHT mid kidney is noted. No other renal,
adrenal or bladder abnormalities are identified.

Stomach/Bowel: There is apparent mild circumferential wall
thickening of the proximal duodenum with mild adjacent
inflammation/stranding suspicious for duodenitis. There is no
evidence of bowel obstruction.

No other bowel wall thickening is identified. The appendix is
normal.

Vascular/Lymphatic: No significant vascular findings are present. No
enlarged abdominal or pelvic lymph nodes.

Reproductive: Uterus and bilateral adnexa are unremarkable.

Other: No ascites, focal collection or pneumoperitoneum.

Musculoskeletal: No acute or suspicious bony abnormality. Moderate
degenerative disc disease/spondylosis with moderate broad-based disc
bulge at L5-S1 noted.
IMPRESSION: 1. Apparent mild circumferential wall thickening of the proximal
duodenum with mild adjacent inflammation/stranding suspicious for
duodenitis. No evidence of bowel obstruction or pneumoperitoneum.
2. Indeterminate 1 cm RIGHT renal mass. If no prior outside studies
are available for comparison, recommend elective MRI with and
without contrast.
3. Moderate degenerative disc disease/spondylosis with moderate
broad-based disc bulge at L5-S1.

## 2020-08-17 MED ORDER — CIPROFLOXACIN HCL 500 MG PO TABS: 500.0000 mg | ORAL_TABLET | Freq: Two times a day (BID) | ORAL | 0 refills | Status: DC

## 2020-08-17 MED ORDER — ONDANSETRON HCL 4 MG/2ML IJ SOLN
4.0000 mg | Freq: Once | INTRAMUSCULAR | Status: AC
  Administered 2020-08-17: 4 mg via INTRAVENOUS
  Filled 2020-08-17: qty 2

## 2020-08-17 MED ORDER — MORPHINE SULFATE (PF) 4 MG/ML IV SOLN
4.0000 mg | Freq: Once | INTRAVENOUS | Status: AC
  Administered 2020-08-17: 4 mg via INTRAVENOUS
  Filled 2020-08-17: qty 1

## 2020-08-17 MED ORDER — IOHEXOL 300 MG/ML  SOLN
100.0000 mL | Freq: Once | INTRAMUSCULAR | Status: AC | PRN
  Administered 2020-08-17: 100 mL via INTRAVENOUS

## 2020-08-17 MED ORDER — FENTANYL CITRATE (PF) 100 MCG/2ML IJ SOLN
50.0000 ug | Freq: Once | INTRAMUSCULAR | Status: AC
  Administered 2020-08-17: 50 ug via INTRAVENOUS
  Filled 2020-08-17: qty 2

## 2020-08-17 MED ORDER — SODIUM CHLORIDE 0.9 % IV BOLUS
1000.0000 mL | Freq: Once | INTRAVENOUS | Status: AC
  Administered 2020-08-17: 1000 mL via INTRAVENOUS

## 2020-08-17 MED ORDER — METRONIDAZOLE 500 MG PO TABS: 500.0000 mg | ORAL_TABLET | Freq: Two times a day (BID) | ORAL | 0 refills | Status: DC

## 2020-08-17 MED ORDER — PROMETHAZINE HCL 25 MG PO TABS: 25.0000 mg | ORAL_TABLET | Freq: Four times a day (QID) | ORAL | 0 refills | Status: DC | PRN

## 2020-08-17 MED ORDER — PROMETHAZINE HCL 25 MG/ML IJ SOLN
12.5000 mg | Freq: Once | INTRAMUSCULAR | Status: AC
  Administered 2020-08-17: 12.5 mg via INTRAVENOUS
  Filled 2020-08-17: qty 1

## 2020-08-17 MED ORDER — HYDROCODONE-ACETAMINOPHEN 5-325 MG PO TABS: 1.0000 | ORAL_TABLET | Freq: Four times a day (QID) | ORAL | 0 refills | Status: DC | PRN

## 2020-08-17 MED ORDER — FAMOTIDINE IN NACL 20-0.9 MG/50ML-% IV SOLN
20.0000 mg | Freq: Once | INTRAVENOUS | Status: AC
  Administered 2020-08-17: 20 mg via INTRAVENOUS
  Filled 2020-08-17: qty 50

## 2020-08-17 MED ORDER — METOCLOPRAMIDE HCL 5 MG/ML IJ SOLN
10.0000 mg | Freq: Once | INTRAMUSCULAR | Status: AC
  Administered 2020-08-17: 10 mg via INTRAVENOUS
  Filled 2020-08-17: qty 2

## 2020-08-17 MED ORDER — ONDANSETRON 4 MG PO TBDP: 4.0000 mg | ORAL_TABLET | Freq: Three times a day (TID) | ORAL | 0 refills | Status: DC | PRN

## 2020-08-17 NOTE — ED Provider Notes (Signed)
I have personally evaluated this patient, she is well-appearing, she is still nauseated but has no pain at this time.  I have reviewed the CT scan report with her, she will need antibiotics, possible duodenitis, no signs of pancreatitis and normal lab work, Zofran Phenergan and a few hydrocodone as well as antibiotic sent to her pharmacy.   Noemi Chapel, MD 08/17/20 [phone]

## 2020-08-17 NOTE — ED Triage Notes (Signed)
Pt arrives POV c/o abd pain with severe vomiting. Pt dx yesterday with pancreatitis at [facility]. Pt given several medications with no relief of pain or vomiting.

## 2020-08-17 NOTE — ED Provider Notes (Signed)
[facility] EMERGENCY DEPARTMENT Provider Note   CSN: [id number] Arrival date & time: 08/16/20  2350   Time seen 1:20 AM  History Chief Complaint  Patient presents with  . Abdominal Pain  . Emesis    Kimberly Bautista is a 29 y.o. female.  HPI   Patient states she is gradually started getting abdominal pain on November 14 or 15.  The pain started in her right lower quadrant and she describes it as sharp.  It has been there constantly.  She states nothing she does makes it hurt more but vomiting seems to make it eased up a little bit.  She states that yesterday, November 18 she also started getting some pain in the epigastric area.  She states in the last 24 hours she has vomited about 10 times.  There has been no blood.  She does describe some burning acid feeling.  She denies any diarrhea but states when she was seen at [facility] in the emergency department last night, 24 hours ago her temperature was 99.4.  She states she had testing done including CT scan and they discharged her home on a prednisone Dosepak and Flexeril for sciatica.  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  She states since she left the ED she is still vomiting, her pain is unchanged.  She now has a dry mouth and decreased urinary output.  But she denies having any dizziness or lightheadedness.  She states she has a herniated disc in her back at L5-S1 and she takes 600 to 800 mg of ibuprofen 3-4 times a day for that.  She has a gastroenterologist in [place] and states she has had colonoscopy twice due to family history of colon cancer.  She has never had endoscopy done.  PCP  Pcp, No   Past Medical History:  Diagnosis Date  . Ulcerative colitis ([place])     There are no problems to display for this patient.   Past Surgical History:  Procedure Laterality Date  . TONSILLECTOMY       OB History   No obstetric history on file.     No family history on file.  Social History    Tobacco Use  . Smoking status: Current Every Day Smoker  . Smokeless tobacco: Never Used  Substance Use Topics  . Alcohol use: Yes  . 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 Authorizing Provider  cetirizine (ZYRTEC) 10 MG tablet Take 10 mg by mouth daily. 01/10/20   [provider]  ibuprofen (ADVIL) 200 MG tablet Take by mouth.    [provider]  metFORMIN (GLUCOPHAGE) 500 MG tablet metformin 500 mg tablet  Take 1 tablet twice a day by oral route. 07/19/18   [provider]  nabumetone (RELAFEN) 750 MG tablet Take 1 tablet (750 mg total) by mouth 2 (two) times daily as needed. 04/10/20   Hilts, Legrand Como, MD  tiZANidine (ZANAFLEX) 2 MG tablet Take 1-2 tablets (2-4 mg total) by mouth every 6 (six) hours as needed for muscle spasms. 04/10/20   Hilts, Legrand Como, MD    Allergies    Penicillins  Review of Systems   Review of Systems  All other systems reviewed and are negative.   Physical Exam Updated Vital Signs BP 107/68   Pulse (!) 57   Temp 98.8 F (37.1 C) (Oral)   Resp 11   Ht  5' 5"  (1.651 m)   Wt 102.1 kg   SpO2 96%   BMI 37.44 kg/m   Physical Exam Vitals and nursing note reviewed.  Constitutional:      General: She is not in acute distress.    Appearance: Normal appearance. She is obese.  HENT:     Head: Normocephalic and atraumatic.     Right Ear: External ear normal.     Left Ear: External ear normal.     Mouth/Throat:     Mouth: Mucous membranes are dry.  Eyes:     Extraocular Movements: Extraocular movements intact.     Conjunctiva/sclera: Conjunctivae normal.     Pupils: Pupils are equal, round, and reactive to light.  Cardiovascular:     Rate and Rhythm: Normal rate and regular rhythm.     Pulses: Normal pulses.     Heart sounds: Normal heart sounds.  Pulmonary:     Effort: Pulmonary effort is normal. No respiratory distress.      Breath sounds: Normal breath sounds.  Abdominal:     Tenderness: There is abdominal tenderness.  Musculoskeletal:        General: Normal range of motion.     Cervical back: Normal range of motion.  Skin:    General: Skin is warm.  Neurological:     General: No focal deficit present.     Mental Status: She is alert and oriented to person, place, and time.  Psychiatric:        Mood and Affect: Mood normal.        Behavior: Behavior normal.        Thought Content: Thought content normal.     ED Results / Procedures / Treatments   Labs (all labs ordered are listed, but only abnormal results are displayed) Results for orders placed or performed during the hospital encounter of 08/17/20  Comprehensive metabolic panel  Result Value Ref Range   Sodium 139 135 - 145 mmol/L   Potassium 4.1 3.5 - 5.1 mmol/L   Chloride 105 98 - 111 mmol/L   CO2 27 22 - 32 mmol/L   Glucose, Bld 98 70 - 99 mg/dL   BUN 11 6 - 20 mg/dL   Creatinine, Ser 0.78 0.44 - 1.00 mg/dL   Calcium 8.6 (L) 8.9 - 10.3 mg/dL   Total Protein 6.9 6.5 - 8.1 g/dL   Albumin 3.9 3.5 - 5.0 g/dL   AST 18 15 - 41 U/L   ALT 31 0 - 44 U/L   Alkaline Phosphatase 51 38 - 126 U/L   Total Bilirubin 0.6 0.3 - 1.2 mg/dL   GFR, Estimated >60 >60 mL/min   Anion gap 7 5 - 15  Lipase, blood  Result Value Ref Range   Lipase 24 11 - 51 U/L  CBC with Differential  Result Value Ref Range   WBC 10.1 4.0 - 10.5 K/uL   RBC 4.66 3.87 - 5.11 MIL/uL   Hemoglobin 14.4 12.0 - 15.0 g/dL   HCT 44.1 36 - 46 %   MCV 94.6 80.0 - 100.0 fL   MCH 30.9 26.0 - 34.0 pg   MCHC 32.7 30.0 - 36.0 g/dL   RDW 12.5 11.5 - 15.5 %   Platelets 284 150 - 400 K/uL   nRBC 0.0 0.0 - 0.2 %   Neutrophils Relative % 70 %   Neutro Abs 7.2 1.7 - 7.7 K/uL   Lymphocytes Relative 20 %   Lymphs Abs 2.1 0.7 - 4.0 K/uL  Monocytes Relative 5 %   Monocytes Absolute 0.5 0.1 - 1.0 K/uL   Eosinophils Relative 3 %   Eosinophils Absolute 0.3 0.0 - 0.5 K/uL   Basophils  Relative 1 %   Basophils Absolute 0.1 0.0 - 0.1 K/uL   Immature Granulocytes 1 %   Abs Immature Granulocytes 0.05 0.00 - 0.07 K/uL   Laboratory interpretation all normal   Respiratory Pathogen Panel with COVID-19 (08/16/2020 1:39 AM EST) Respiratory Pathogen Panel with COVID-19 (08/16/2020 1:39 AM EST)  Component Value Ref Range Performed At Pathologist Signature  Adenovirus Not Detected Not Detected [facility] LABORATORY   Coronavirus PCR (229E, HKU1, NL63, OC43) Not Detected Not Detected [place] Not Detected Not Detected [place]   Rhinovirus/Enterovirus Not Detected Not Detected [facility]   Influenza A Not Detected Not Detected [place]   Influenza A/H1 Not Detected Not Detected [facility] LABORAT[facility]   Influenza A/H3 Not Detected Not Detected [facility] LAB[facility]ATORY   Influenza A/H1-2009 Not Detected Not Detected [facility] LABORAT[facility]   Influenza B Not Detected Not Detected [place]   Parainfluenza 1 Not Detected Not Detected [place]   Parainfluenza 2 Not Detected Not Detected [place]   Parainfluenza 3 Not Detected Not Detected [place]   Parainfluenza 4 Not Detected Not Detected [place]   SARS-CoV-2 PCR Not Detected Not Detected [place]   RSV A PCR Not Detected Not Detected [place]   RSV B PCR Not Detected Not Detected [place]   Chlamydophila (Chlamydia) pneumoniae Not Detected Not Detected [place]   Mycoplasma pneumoniae Not Detected Not Detected [place]      EKG None  Radiology No results found.   MR Lumbar Spine w/o contrast  Result Date: 07/26/2020 CLINICAL DATA:  Lumbar radiculopathy;  technologist note states low back pain radiating into right leg  IMPRESSION: Lower lumbar degenerative changes. Most notably, a disc protrusion at L5-S1 contacts the traversing right S1 nerve root and may account for reported radiculopathy. Electronically Signed   By: Macy Mis M.D.   On: 07/26/2020 09:41    CT abdomen/pelvis without contrast August 16, 2020 IMPRESSION: 1. Inflammatory changes which appear centered in the region of the pancreaticoduodenal groove with thickening of the first and second portions of the duodenum and inflammation about the head and uncinate with relative sparing of the more distal pancreatic neck body and tail. Findings could reflect a groove pancreatitis versus a nonspecific duodenitis or ulcer poorly visualized on this unenhanced CT. Recommend correlation with lipase and direct visualization as clinically warranted. 2. Nonobstructing left nephrolithiasis. 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 No obstructive urolithiasis or hydronephrosis.   Electronically Signed By: Lovena Le M.D. On: 08/16/2020 04:22     Procedures Procedures (including critical care time)  Medications Ordered in ED Medications  sodium chloride 0.9 % bolus 1,000 mL (0 mLs Intravenous Stopped 08/17/20 0355)  ondansetron (ZOFRAN) injection 4 mg (4 mg Intravenous Given 08/17/20 0105)  fentaNYL (SUBLIMAZE) injection 50 mcg (50 mcg Intravenous Given 08/17/20 0139)  famotidine (PEPCID) IVPB 20 mg premix (0 mg Intravenous Stopped 08/17/20 0257)  sodium chloride 0.9 % bolus 1,000 mL (0 mLs Intravenous Stopped 08/17/20 0355)  ondansetron (ZOFRAN) injection 4 mg (4 mg Intravenous Given 08/17/20 0151)  metoCLOPramide (REGLAN) injection 10 mg (10 mg Intravenous Given 08/17/20 0311)  morphine 4 MG/ML injection 4 mg (  4 mg Intravenous Given 08/17/20 0402)  iohexol (OMNIPAQUE) 300 MG/ML solution 100 mL (100 mLs Intravenous Contrast Given 08/17/20 7564)     ED Course  I have reviewed the triage vital signs and the nursing notes.  Pertinent labs & imaging results that were available during my care of the patient were reviewed by me and considered in my medical decision making (see chart for details).    MDM Rules/Calculators/A&P                          Patient was given IV fluids for dehydration.  She was given IV Zofran and IV fentanyl for pain.  I also started her on IV Pepcid due to the burning quality of the vomitus.  Patient's laboratory testing is all normal.  Patient has required several doses of nausea medication and pain medication.  I decided to proceed with another CT of her abdomen/pelvis due to her significant pain in the right lower quadrant.  8:00 AM patient turned over to Dr. Sabra Heck at change of shift to get results of her CT.  Final Clinical Impression(s) / ED Diagnoses Final diagnoses:  RLQ abdominal pain  Intractable vomiting with nausea, unspecified vomiting type    Rx / DC Orders  Disposition pending  Rolland Porter, MD, Barbette Or, MD 08/17/20 [phone]

## 2020-08-17 NOTE — Discharge Instructions (Signed)
Your CT scan shows that you have some inflammation of your small bowel, there is no evidence of pancreatitis.  I have recommended nausea medication including Zofran as a first-line but if it is not working you may try Phenergan which is much more sedating and may make you sleepy.  I have also sent a small amount of pain medication over to your pharmacy.  I would recommend that you go on a clear liquid diet for a couple of days.  Return to the ER for severe worsening symptoms.

## 2020-08-18 ENCOUNTER — Emergency Department (HOSPITAL_COMMUNITY)
Admission: EM | Admit: 2020-08-18 | Discharge: 2020-08-19 | Disposition: A | Discharge status: Home / Self Care | Payer: Commercial Managed Care - PPO | Attending: Emergency Medicine | Admitting: Emergency Medicine

## 2020-08-18 ENCOUNTER — Other Ambulatory Visit: Payer: Self-pay

## 2020-08-18 ENCOUNTER — Encounter (HOSPITAL_COMMUNITY): Payer: Self-pay | Admitting: Emergency Medicine

## 2020-08-18 DIAGNOSIS — R112 Nausea with vomiting, unspecified: Secondary | ICD-10-CM

## 2020-08-18 DIAGNOSIS — R1013 Epigastric pain: Secondary | ICD-10-CM

## 2020-08-18 DIAGNOSIS — Z8719 Personal history of other diseases of the digestive system: Secondary | ICD-10-CM | POA: Insufficient documentation

## 2020-08-18 DIAGNOSIS — F17200 Nicotine dependence, unspecified, uncomplicated: Secondary | ICD-10-CM | POA: Diagnosis not present

## 2020-08-18 LAB — CBC WITH DIFFERENTIAL/PLATELET
Abs Immature Granulocytes: 0.05 10*3/uL (ref 0.00–0.07)
Basophils Absolute: 0.1 10*3/uL (ref 0.0–0.1)
Basophils Relative: 1 %
Eosinophils Absolute: 0.1 10*3/uL (ref 0.0–0.5)
Eosinophils Relative: 1 %
HCT: 48.9 % — ABNORMAL HIGH (ref 36.0–46.0)
Hemoglobin: 16.4 g/dL — ABNORMAL HIGH (ref 12.0–15.0)
Immature Granulocytes: 0 %
Lymphocytes Relative: 21 %
Lymphs Abs: 2.7 10*3/uL (ref 0.7–4.0)
MCH: 30.4 pg (ref 26.0–34.0)
MCHC: 33.5 g/dL (ref 30.0–36.0)
MCV: 90.7 fL (ref 80.0–100.0)
Monocytes Absolute: 0.5 10*3/uL (ref 0.1–1.0)
Monocytes Relative: 4 %
Neutro Abs: 9.3 10*3/uL — ABNORMAL HIGH (ref 1.7–7.7)
Neutrophils Relative %: 73 %
Platelets: 353 10*3/uL (ref 150–400)
RBC: 5.39 MIL/uL — ABNORMAL HIGH (ref 3.87–5.11)
RDW: 12.2 % (ref 11.5–15.5)
WBC: 12.8 10*3/uL — ABNORMAL HIGH (ref 4.0–10.5)
nRBC: 0 % (ref 0.0–0.2)

## 2020-08-18 LAB — COMPREHENSIVE METABOLIC PANEL
ALT: 31 U/L (ref 0–44)
AST: 17 U/L (ref 15–41)
Albumin: 4.7 g/dL (ref 3.5–5.0)
Alkaline Phosphatase: 63 U/L (ref 38–126)
Anion gap: 10 (ref 5–15)
BUN: 8 mg/dL (ref 6–20)
CO2: 26 mmol/L (ref 22–32)
Calcium: 9.8 mg/dL (ref 8.9–10.3)
Chloride: 101 mmol/L (ref 98–111)
Creatinine, Ser: 0.77 mg/dL (ref 0.44–1.00)
GFR, Estimated: >60 mL/min (ref >60)
Glucose, Bld: 103 mg/dL — ABNORMAL HIGH (ref 70–99)
Potassium: 4 mmol/L (ref 3.5–5.1)
Sodium: 137 mmol/L (ref 135–145)
Total Bilirubin: 0.5 mg/dL (ref 0.3–1.2)
Total Protein: 8.5 g/dL — ABNORMAL HIGH (ref 6.5–8.1)

## 2020-08-18 LAB — I-STAT BETA HCG BLOOD, ED (MC, WL, AP ONLY): I-stat hCG, quantitative: <5 m[IU]/mL (ref <5)

## 2020-08-18 LAB — LIPASE, BLOOD: Lipase: 22 U/L (ref 11–51)

## 2020-08-18 MED ORDER — ONDANSETRON 4 MG PO TBDP: 4.0000 mg | ORAL_TABLET | Freq: Three times a day (TID) | ORAL | 1 refills | Status: DC | PRN

## 2020-08-18 MED ORDER — SODIUM CHLORIDE 0.9 % IV SOLN: INTRAVENOUS | Status: DC

## 2020-08-18 MED ORDER — PANTOPRAZOLE SODIUM 40 MG IV SOLR
40.0000 mg | Freq: Once | INTRAVENOUS | Status: AC
  Administered 2020-08-18: 40 mg via INTRAVENOUS
  Filled 2020-08-18: qty 40

## 2020-08-18 MED ORDER — SODIUM CHLORIDE 0.9 % IV BOLUS
1000.0000 mL | Freq: Once | INTRAVENOUS | Status: AC
  Administered 2020-08-18: 1000 mL via INTRAVENOUS

## 2020-08-18 MED ORDER — FAMOTIDINE 20 MG PO TABS: 20.0000 mg | ORAL_TABLET | Freq: Two times a day (BID) | ORAL | 0 refills | Status: DC

## 2020-08-18 MED ORDER — METOCLOPRAMIDE HCL 5 MG/ML IJ SOLN
10.0000 mg | Freq: Once | INTRAMUSCULAR | Status: AC
  Administered 2020-08-18: 10 mg via INTRAVENOUS
  Filled 2020-08-18: qty 2

## 2020-08-18 MED ORDER — HYDROMORPHONE HCL 1 MG/ML IJ SOLN
1.0000 mg | Freq: Once | INTRAMUSCULAR | Status: AC
  Administered 2020-08-18: 1 mg via INTRAVENOUS
  Filled 2020-08-18: qty 1

## 2020-08-18 MED ORDER — ONDANSETRON 4 MG PO TBDP
4.0000 mg | ORAL_TABLET | Freq: Once | ORAL | Status: AC | PRN
  Administered 2020-08-18: 4 mg via ORAL
  Filled 2020-08-18: qty 1

## 2020-08-18 MED ORDER — ONDANSETRON HCL 4 MG/2ML IJ SOLN
4.0000 mg | Freq: Once | INTRAMUSCULAR | Status: AC
  Administered 2020-08-18: 4 mg via INTRAVENOUS
  Filled 2020-08-18: qty 2

## 2020-08-18 NOTE — ED Notes (Signed)
Report N continues to await eval

## 2020-08-18 NOTE — ED Notes (Signed)
Resting   Labs rested   Awaiting reeval and dispo

## 2020-08-18 NOTE — ED Notes (Signed)
Awaiting eval

## 2020-08-18 NOTE — ED Notes (Signed)
Seen here yesterday   Sent home w meds   Here after vomiting "twelve time" today   abd soft non tender pt points to pain site as epigastric

## 2020-08-18 NOTE — ED Notes (Signed)
Pt continues to complain of N   meds provided  Awaiting eval

## 2020-08-18 NOTE — Discharge Instructions (Signed)
Follow-up gastroenterology give him a call on Monday.  Start taking Pepcid as directed.  Also given the dissolvable Zofran to take.  You can take this with your Phenergan.  Return for any new or worse symptoms.  Development of fevers vomiting blood or vomiting does persist she will have to be reevaluated.  Considerations for outpatient work-up to include upper endoscopy may be HIDA scan ultrasound of the right upper quadrant to take a closer look at the gallbladder.

## 2020-08-18 NOTE — ED Notes (Signed)
Dellis Filbert RN in to attempt [place] IV

## 2020-08-18 NOTE — ED Provider Notes (Signed)
[facility] EMERGENCY DEPARTMENT Provider Note   CSN: [id number] Arrival date & time: 08/18/20  1801     History Chief Complaint  Patient presents with  . Emesis    Kimberly Bautista is a 29 y.o. female.  Patient with persistent nausea and vomiting.  And epigastric abdominal pain.  Patient seen at [facility] in [place] on Friday.  CT scan of the abdomen done which showed inflammation around the first part of the small intestines.  Has had persistent vomiting was seen again here on Saturday morning.  Had repeat CT scan which confirmed those findings.  Patient received IV fluids antinausea medicine pain medicine vomiting settled down and she was discharged home with Phenergan and Zofran to take at home.  Patient did okay for about half a day then vomiting started again.  Patient here again with epigastric abdominal pain and vomiting no vomiting of blood.  No fevers.  Patient's labs on Saturday which included complete metabolic panel and lipase were normal.        Past Medical History:  Diagnosis Date  . Ulcerative colitis ([place])     There are no problems to display for this patient.   Past Surgical History:  Procedure Laterality Date  . TONSILLECTOMY       OB History   No obstetric history on file.     History reviewed. No pertinent family history.  Social History   Tobacco Use  . Smoking status: Current Every Day Smoker  . Smokeless tobacco: Never Used  Substance Use Topics  . Alcohol use: Yes  . Drug use: Never    Home Medications Prior to Admission medications   Medication Sig Start Date End Date Taking? Authorizing Provider  cetirizine (ZYRTEC) 10 MG tablet Take 10 mg by mouth daily. 01/10/20  Yes [provider]  HYDROcodone-acetaminophen (NORCO/VICODIN) 5-325 MG tablet Take 1 tablet by mouth every 6 (six) hours as needed. 08/17/20  Yes Noemi Chapel, MD  ibuprofen (ADVIL) 200 MG tablet Take by mouth.   Yes [provider]    metroNIDAZOLE (FLAGYL) 500 MG tablet Take 1 tablet (500 mg total) by mouth 2 (two) times daily. 08/17/20  Yes Noemi Chapel, MD  promethazine (PHENERGAN) 25 MG tablet Take 1 tablet (25 mg total) by mouth every 6 (six) hours as needed for nausea or vomiting. 08/17/20  Yes Noemi Chapel, MD  azithromycin (ZITHROMAX) 250 MG tablet Take 250 mg by mouth as directed. Patient not taking: Reported on 08/18/2020 08/04/20   [provider]  ciprofloxacin (CIPRO) 500 MG tablet Take 1 tablet (500 mg total) by mouth 2 (two) times daily. Patient not taking: Reported on 08/18/2020 08/17/20   Noemi Chapel, MD  cyclobenzaprine (FLEXERIL) 10 MG tablet Take by mouth. Patient not taking: Reported on 08/18/2020 08/16/20   [provider]  famotidine (PEPCID) 20 MG tablet Take 1 tablet (20 mg total) by mouth 2 (two) times daily. 08/18/20   Fredia Sorrow, MD  nabumetone (RELAFEN) 750 MG tablet Take 1 tablet (750 mg total) by mouth 2 (two) times daily as needed. Patient not taking: Reported on 08/18/2020 04/10/20   Hilts, Legrand Como, MD  ondansetron (ZOFRAN ODT) 4 MG disintegrating tablet Take 1 tablet (4 mg total) by mouth every 8 (eight) hours as needed. 08/18/20   Fredia Sorrow, MD  tiZANidine (ZANAFLEX) 2 MG tablet Take 1-2 tablets (2-4 mg total) by mouth every 6 (six) hours as needed for muscle spasms. Patient not taking: Reported on 08/18/2020 04/10/20   Hilts, Legrand Como,  MD    Allergies    Penicillins  Review of Systems   Review of Systems  Constitutional: Negative for chills and fever.  HENT: Negative for rhinorrhea and sore throat.   Eyes: Negative for visual disturbance.  Respiratory: Negative for cough and shortness of breath.   Cardiovascular: Negative for chest pain and leg swelling.  Gastrointestinal: Positive for abdominal pain, nausea and vomiting. Negative for blood in stool and diarrhea.  Genitourinary: Negative for dysuria.  Musculoskeletal: Negative for back pain and neck  pain.  Skin: Negative for rash.  Neurological: Negative for dizziness, light-headedness and headaches.  Hematological: Does not bruise/bleed easily.  Psychiatric/Behavioral: Negative for confusion.    Physical Exam Updated Vital Signs BP (!) 129/91   Pulse 71   Temp 98.3 F (36.8 C) (Oral)   Resp 13   Ht 1.651 m (5' 5" )   Wt 102.1 kg   LMP  (LMP Unknown)   SpO2 91%   BMI 37.44 kg/m   Physical Exam Vitals and nursing note reviewed.  Constitutional:      General: She is not in acute distress.    Appearance: Normal appearance. She is well-developed.  HENT:     Head: Normocephalic and atraumatic.  Eyes:     Extraocular Movements: Extraocular movements intact.     Conjunctiva/sclera: Conjunctivae normal.     Pupils: Pupils are equal, round, and reactive to light.  Cardiovascular:     Rate and Rhythm: Normal rate and regular rhythm.     Heart sounds: No murmur heard.   Pulmonary:     Effort: Pulmonary effort is normal. No respiratory distress.     Breath sounds: Normal breath sounds.  Abdominal:     General: There is no distension.     Palpations: Abdomen is soft.     Tenderness: There is no abdominal tenderness.  Musculoskeletal:        General: No swelling. Normal range of motion.     Cervical back: Normal range of motion and neck supple.  Skin:    General: Skin is warm and dry.     Capillary Refill: Capillary refill takes less than 2 seconds.  Neurological:     General: No focal deficit present.     Mental Status: She is alert and oriented to person, place, and time.     Cranial Nerves: No cranial nerve deficit.     Sensory: No sensory deficit.     Motor: No weakness.     ED Results / Procedures / Treatments   Labs (all labs ordered are listed, but only abnormal results are displayed) Labs Reviewed  CBC WITH DIFFERENTIAL/PLATELET - Abnormal; Notable for the following components:      Result Value   WBC 12.8 (*)    RBC 5.39 (*)    Hemoglobin 16.4 (*)     HCT 48.9 (*)    Neutro Abs 9.3 (*)    All other components within normal limits  COMPREHENSIVE METABOLIC PANEL - Abnormal; Notable for the following components:   Glucose, Bld 103 (*)    Total Protein 8.5 (*)    All other components within normal limits  LIPASE, BLOOD  I-STAT BETA HCG BLOOD, ED ([facility], [facility], [facility] ONLY)    EKG None  Radiology CT Abdomen Pelvis W Contrast  Result Date: 08/17/2020 CLINICAL DATA:  29 year old female with RIGHT abdominal and pelvic pain for 4 days. EXAM: CT ABDOMEN AND PELVIS WITH CONTRAST TECHNIQUE: Multidetector CT imaging of the abdomen and pelvis was performed  using the standard protocol following bolus administration of intravenous contrast. CONTRAST:  134m OMNIPAQUE IOHEXOL 300 MG/ML  SOLN COMPARISON:  08/16/2020 CT FINDINGS: Lower chest: No acute abnormality. Hepatobiliary: No significant liver or gallbladder abnormalities identified. Pancreas: Unremarkable Spleen: Unremarkable Adrenals/Urinary Tract: An indeterminate 1 cm mass within the LATERAL aspect of the RIGHT mid kidney is noted. No other renal, adrenal or bladder abnormalities are identified. Stomach/Bowel: There is apparent mild circumferential wall thickening of the proximal duodenum with mild adjacent inflammation/stranding suspicious for duodenitis. There is no evidence of bowel obstruction. No other bowel wall thickening is identified. The appendix is normal. Vascular/Lymphatic: No significant vascular findings are present. No enlarged abdominal or pelvic lymph nodes. Reproductive: Uterus and bilateral adnexa are unremarkable. Other: No ascites, focal collection or pneumoperitoneum. Musculoskeletal: No acute or suspicious bony abnormality. Moderate degenerative disc disease/spondylosis with moderate broad-based disc bulge at L5-S1 noted. IMPRESSION: 1. Apparent mild circumferential wall thickening of the proximal duodenum with mild adjacent inflammation/stranding suspicious for duodenitis. No evidence of  bowel obstruction or pneumoperitoneum. 2. Indeterminate 1 cm RIGHT renal mass. If no prior outside studies are available for comparison, recommend elective MRI with and without contrast. 3. Moderate degenerative disc disease/spondylosis with moderate broad-based disc bulge at L5-S1. Electronically Signed   By: JMargarette CanadaM.D.   On: 08/17/2020 08:17    Procedures Procedures (including critical care time)   Medications Ordered in ED Medications  0.9 %  sodium chloride infusion ( Intravenous New Bag/Given 08/18/20 2026)  ondansetron (ZOFRAN-ODT) disintegrating tablet 4 mg (4 mg Oral Given 08/18/20 1940)  sodium chloride 0.9 % bolus 1,000 mL (0 mLs Intravenous Stopped 08/18/20 2237)  ondansetron (ZOFRAN) injection 4 mg (4 mg Intravenous Given 08/18/20 2028)  HYDROmorphone (DILAUDID) injection 1 mg (1 mg Intravenous Given 08/18/20 2027)  metoCLOPramide (REGLAN) injection 10 mg (10 mg Intravenous Given 08/18/20 2028)  pantoprazole (PROTONIX) injection 40 mg (40 mg Intravenous Given 08/18/20 2027)  sodium chloride 0.9 % bolus 1,000 mL (1,000 mLs Intravenous New Bag/Given 08/18/20 2249)    ED Course  I have reviewed the triage vital signs and the nursing notes.  Pertinent labs & imaging results that were available during my care of the patient were reviewed by me and considered in my medical decision making (see chart for details).    MDM Rules/Calculators/A&P                          Work-up here again tonight labs without any significant abnormalities.  White blood cell count slightly elevated at 12,000.  And hemoglobin is 16.4 consistent with some hemoconcentration.  But patient's complete metabolic panel including liver function test and lipase all normal.  Pregnancy test negative.  Patient treated here with hydromorphone and Reglan and received 2 L of IV fluids with significant improvement.  Abdomen is nontender.  Possibilities are duodenal ulcer due to the inflammation.  Patient is to be  taking Cipro and Flagyl based on the CAT scans from before.  But it is possible this could be peptic ulcer disease.  Dysfunctional gallbladder not completely ruled out.  That would require HIDA scan.  Upper endoscopy would need to confirm peptic ulcer disease.  Patient could receive ultrasound right upper quadrant for further evaluation of the gallbladder.  Patient has no tenderness on the abdomen.  In the last 2 scans showed no abnormalities of the gallbladder at all.  Patient was given referral to GI medicine locally.  She will call on  Monday.  Work note provided.  We will switch her over to ODT Zofran to take along with her Phenergan.  And start her on Pepcid.  Patient was given Protonix here tonight.  No indication for admission at this time.  Patient will need to return for fevers vomiting blood or for persistent vomiting.    Final Clinical Impression(s) / ED Diagnoses Final diagnoses:  Intractable vomiting with nausea, unspecified vomiting type  Epigastric pain    Rx / DC Orders ED Discharge Orders         Ordered    ondansetron (ZOFRAN ODT) 4 MG disintegrating tablet  Every 8 hours PRN        08/18/20 2321    famotidine (PEPCID) 20 MG tablet  2 times daily        08/18/20 2321           Fredia Sorrow, MD 08/18/20 2327

## 2020-08-18 NOTE — ED Triage Notes (Addendum)
Pt was seen here with a dx with ulcerative colitis and placed on flagyl and Cipro yesterday. Pt states she has been unable to eat or tolerate the abx due to vomiting. Vomit is yellow in color and taste like bile.  Pt is actively vomiting in triage. Pt has tried oral phenergan and Zofran without relief.

## 2020-08-19 ENCOUNTER — Telehealth: Payer: Self-pay | Admitting: Physical Medicine and Rehabilitation

## 2020-08-19 ENCOUNTER — Telehealth: Payer: Self-pay

## 2020-08-19 NOTE — Telephone Encounter (Signed)
Patient's mother called and states patient has a medical emergency and cant make it. Mother will call when patient is all better. Mother's phone number is [phone].

## 2020-08-19 NOTE — Telephone Encounter (Signed)
Patients mother Marcie Bal called to schedule ED follow up. Patient is currently with [facility] also had a colonoscopy report requested for review.

## 2020-08-19 NOTE — Telephone Encounter (Signed)
Appointment cancelled

## 2020-08-20 ENCOUNTER — Ambulatory Visit: Payer: Commercial Managed Care - PPO | Admitting: Physical Medicine and Rehabilitation

## 2020-08-21 ENCOUNTER — Telehealth: Payer: Self-pay | Admitting: Physical Medicine and Rehabilitation

## 2020-08-21 NOTE — Telephone Encounter (Signed)
Patient called requesting a call back to reschedule appt. Please call patient at [phone]

## 2020-08-26 NOTE — Telephone Encounter (Signed)
Called pt and resch for 12/22

## 2020-08-27 ENCOUNTER — Ambulatory Visit: Payer: Commercial Managed Care - PPO | Admitting: Physical Medicine and Rehabilitation

## 2020-08-29 ENCOUNTER — Ambulatory Visit (INDEPENDENT_AMBULATORY_CARE_PROVIDER_SITE_OTHER): Payer: Commercial Managed Care - PPO | Admitting: Physical Medicine and Rehabilitation

## 2020-08-29 ENCOUNTER — Encounter: Payer: Self-pay | Admitting: Physical Medicine and Rehabilitation

## 2020-08-29 ENCOUNTER — Other Ambulatory Visit: Payer: Self-pay

## 2020-08-29 ENCOUNTER — Ambulatory Visit: Payer: Self-pay

## 2020-08-29 VITALS — BP 127/83 | HR 68

## 2020-08-29 DIAGNOSIS — M5416 Radiculopathy, lumbar region: Secondary | ICD-10-CM | POA: Diagnosis not present

## 2020-08-29 MED ORDER — BETAMETHASONE SOD PHOS & ACET 6 (3-3) MG/ML IJ SUSP
12.0000 mg | Freq: Once | INTRAMUSCULAR | Status: AC
  Administered 2020-08-29: 12 mg

## 2020-08-29 NOTE — Procedures (Signed)
Lumbar Epidural Steroid Injection - Interlaminar Approach with Fluoroscopic Guidance  Patient: Kimberly Bautista      Date of Birth: [birth year] MRN: [id number] PCP: Pcp, No      Visit Date: 08/29/2020   Universal Protocol:     Consent Given By: the patient  Position: PRONE  Additional Comments: Vital signs were monitored before and after the procedure. Patient was prepped and draped in the usual sterile fashion. The correct patient, procedure, and site was verified.   Injection Procedure Details:   Procedure diagnoses: Lumbar radiculopathy [M54.16]   Meds Administered:  Meds ordered this encounter  Medications  . betamethasone acetate-betamethasone sodium phosphate (CELESTONE) injection 12 mg     Laterality: Right  Location/Site:  L5-S1  Needle: 3.5 in., 20 ga. Tuohy  Needle Placement: Paramedian epidural  Findings:   -Comments: Excellent flow of contrast into the epidural space.  Procedure Details: Using a paramedian approach from the side mentioned above, the region overlying the inferior lamina was localized under fluoroscopic visualization and the soft tissues overlying this structure were infiltrated with 4 ml. of 1% Lidocaine without Epinephrine. The Tuohy needle was inserted into the epidural space using a paramedian approach.   The epidural space was localized using loss of resistance along with counter oblique bi-planar fluoroscopic views.  After negative aspirate for air, blood, and CSF, a 2 ml. volume of Isovue-250 was injected into the epidural space and the flow of contrast was observed. Radiographs were obtained for documentation purposes.    The injectate was administered into the level noted above.   Additional Comments:  The patient tolerated the procedure well Dressing: 2 x 2 sterile gauze and Band-Aid    Post-procedure details: Patient was observed during the procedure. Post-procedure instructions were reviewed.  Patient left the clinic in  stable condition.

## 2020-08-29 NOTE — Progress Notes (Signed)
Kimberly Bautista - 29 y.o. female MRN [id number]  Date of birth: 01/13/1991  Office Visit Note: Visit Date: 08/29/2020 PCP: Pcp, No Referred by: Toma Aran, PA-C  Subjective: Chief Complaint  Patient presents with  . Lower Back - Pain  . Right Leg - Pain   HPI:  Kimberly Bautista is a 29 y.o. female who comes in today at the request of Dr. Eunice Blase for planned Right L5-S1 Lumbar epidural steroid injection with fluoroscopic guidance.  The patient has failed conservative care including home exercise, medications, time and activity modification.  This injection will be diagnostic and hopefully therapeutic.  Please see requesting physician notes for further details and justification.  MRI reviewed with images and spine model.  MRI reviewed in the note below.   ROS Otherwise per HPI.  Assessment & Plan: Visit Diagnoses:    ICD-10-CM   1. Lumbar radiculopathy  M54.16 XR C-ARM NO REPORT    Epidural Steroid injection    betamethasone acetate-betamethasone sodium phosphate (CELESTONE) injection 12 mg    Plan: No additional findings.   Meds & Orders:  Meds ordered this encounter  Medications  . betamethasone acetate-betamethasone sodium phosphate (CELESTONE) injection 12 mg    Orders Placed This Encounter  Procedures  . XR C-ARM NO REPORT  . Epidural Steroid injection    Follow-up: Return for visit to requesting physician as needed.   Procedures: No procedures performed  Lumbar Epidural Steroid Injection - Interlaminar Approach with Fluoroscopic Guidance  Patient: Kimberly Bautista      Date of Birth: November 23, 1990 MRN: [id number] PCP: Pcp, No      Visit Date: 08/29/2020   Universal Protocol:     Consent Given By: the patient  Position: PRONE  Additional Comments: Vital signs were monitored before and after the procedure. Patient was prepped and draped in the usual sterile fashion. The correct patient, procedure, and site was verified.   Injection Procedure Details:    Procedure diagnoses: Lumbar radiculopathy [M54.16]   Meds Administered:  Meds ordered this encounter  Medications  . betamethasone acetate-betamethasone sodium phosphate (CELESTONE) injection 12 mg     Laterality: Right  Location/Site:  L5-S1  Needle: 3.5 in., 20 ga. Tuohy  Needle Placement: Paramedian epidural  Findings:   -Comments: Excellent flow of contrast into the epidural space.  Procedure Details: Using a paramedian approach from the side mentioned above, the region overlying the inferior lamina was localized under fluoroscopic visualization and the soft tissues overlying this structure were infiltrated with 4 ml. of 1% Lidocaine without Epinephrine. The Tuohy needle was inserted into the epidural space using a paramedian approach.   The epidural space was localized using loss of resistance along with counter oblique bi-planar fluoroscopic views.  After negative aspirate for air, blood, and CSF, a 2 ml. volume of Isovue-250 was injected into the epidural space and the flow of contrast was observed. Radiographs were obtained for documentation purposes.    The injectate was administered into the level noted above.   Additional Comments:  The patient tolerated the procedure well Dressing: 2 x 2 sterile gauze and Band-Aid    Post-procedure details: Patient was observed during the procedure. Post-procedure instructions were reviewed.  Patient left the clinic in stable condition.     Clinical History: MRI LUMBAR SPINE WITHOUT CONTRAST  TECHNIQUE: Multiplanar, multisequence MR imaging of the lumbar spine was performed. No intravenous contrast was administered.  COMPARISON:  None.  FINDINGS: Segmentation:  Standard.  Alignment:  Anteroposterior alignment is maintained.  Vertebrae:  Vertebral body heights are maintained. No significant marrow edema. No suspicious osseous lesion.  Conus medullaris and cauda equina: Conus extends to the L2 level. Conus  and cauda equina appear normal.  Paraspinal and other soft tissues: Unremarkable.  Disc levels:  L1-L2:  No stenosis.  L2-L3:  No stenosis.  L3-L4:  No stenosis.  L4-L5:  Disc desiccation.  Minimal disc bulge.  No stenosis.  L5-S1: Disc desiccation and mild height loss. Central disc protrusion flattening the ventral thecal sac. No canal stenosis. Disc contacts the traversing right S1 nerve root. No foraminal stenosis.  IMPRESSION: Lower lumbar degenerative changes. Most notably, a disc protrusion at L5-S1 contacts the traversing right S1 nerve root and may account for reported radiculopathy.   Electronically Signed   By: Macy Mis M.D.   On: 07/26/2020 09:41     Objective:  VS:  HT:    WT:   BMI:     BP:127/83  HR:68bpm  TEMP: ( )  RESP:  Physical Exam Vitals and nursing note reviewed.  Constitutional:      General: She is not in acute distress.    Appearance: Normal appearance. She is obese. She is not ill-appearing.  HENT:     Head: Normocephalic and atraumatic.     Right Ear: External ear normal.     Left Ear: External ear normal.  Eyes:     Extraocular Movements: Extraocular movements intact.  Cardiovascular:     Rate and Rhythm: Normal rate.     Pulses: Normal pulses.  Pulmonary:     Effort: Pulmonary effort is normal. No respiratory distress.  Abdominal:     General: There is no distension.     Palpations: Abdomen is soft.  Musculoskeletal:        General: Tenderness present.     Cervical back: Neck supple.     Right lower leg: No edema.     Left lower leg: No edema.     Comments: Patient has good distal strength with no pain over the greater trochanters.  No clonus or focal weakness.  Skin:    Findings: No erythema, lesion or rash.  Neurological:     General: No focal deficit present.     Mental Status: She is alert and oriented to person, place, and time.     Sensory: No sensory deficit.     Motor: No weakness or abnormal  muscle tone.     Coordination: Coordination normal.  Psychiatric:        Mood and Affect: Mood normal.        Behavior: Behavior normal.      Imaging: No results found.

## 2020-08-29 NOTE — Progress Notes (Signed)
Pt state lower back pain that travels downher right leg. Pt state standing for a long period of time and bending makes the pain worse. Pt state she cant take pain meds , so she use heating pads.  Numeric Pain Rating Scale and Functional Assessment Average Pain 7   In the last MONTH (on 0-10 scale) has pain interfered with the following?  1. General activity like being  able to carry out your everyday physical activities such as walking, climbing stairs, carrying groceries, or moving a chair?  Rating(10)   +Driver, -BT, -Dye Allergies.

## 2020-09-18 ENCOUNTER — Ambulatory Visit: Payer: Commercial Managed Care - PPO | Admitting: Physical Medicine and Rehabilitation

## 2020-12-09 ENCOUNTER — Telehealth: Payer: Self-pay

## 2020-12-09 NOTE — Telephone Encounter (Signed)
Pt would like a call back to schedule an apt with Dr . Ernestina Patches

## 2020-12-16 ENCOUNTER — Telehealth: Payer: Self-pay | Admitting: Physical Medicine and Rehabilitation

## 2020-12-16 NOTE — Telephone Encounter (Signed)
Okay 

## 2020-12-16 NOTE — Telephone Encounter (Signed)
Patient called last week and is asking for a return phone call to set appt. Please call pt at [phone].

## 2020-12-16 NOTE — Telephone Encounter (Signed)
Right L5-S1 IL 08/29/20. Ok to repeat if helped, same problem/side, and no new injury?

## 2020-12-17 ENCOUNTER — Telehealth: Payer: Self-pay | Admitting: Physical Medicine and Rehabilitation

## 2020-12-17 NOTE — Telephone Encounter (Signed)
Pt would like a call back about getting an injection in her lower back CB [phone]

## 2020-12-17 NOTE — Telephone Encounter (Signed)
See previous message

## 2020-12-17 NOTE — Telephone Encounter (Signed)
Is auth needed for right L5-S1 IL? Scheduled for 3/28 with driver.

## 2020-12-17 NOTE — Telephone Encounter (Signed)
Left message #1

## 2020-12-19 NOTE — Telephone Encounter (Signed)
Pt insurance is PENDING.

## 2020-12-23 ENCOUNTER — Encounter: Payer: Self-pay | Admitting: Physical Medicine and Rehabilitation

## 2020-12-23 ENCOUNTER — Other Ambulatory Visit: Payer: Self-pay

## 2020-12-23 ENCOUNTER — Ambulatory Visit: Payer: Self-pay

## 2020-12-23 ENCOUNTER — Ambulatory Visit (INDEPENDENT_AMBULATORY_CARE_PROVIDER_SITE_OTHER): Payer: Commercial Managed Care - PPO | Admitting: Physical Medicine and Rehabilitation

## 2020-12-23 VITALS — BP 137/96 | HR 80

## 2020-12-23 DIAGNOSIS — M5416 Radiculopathy, lumbar region: Secondary | ICD-10-CM

## 2020-12-23 MED ORDER — BETAMETHASONE SOD PHOS & ACET 6 (3-3) MG/ML IJ SUSP
12.0000 mg | Freq: Once | INTRAMUSCULAR | Status: AC
  Administered 2020-12-23: 12 mg

## 2020-12-23 NOTE — Progress Notes (Unsigned)
Pt state lower back pain that travels down her right leg. Pt state walking, standing and sitting makes the pain worse. Pt state she use heating pads and lay down straight to help ease her pain. Pt has hx of inj on 08/29/20 pt state it helped for six weeks with 90% relief.  Numeric Pain Rating Scale and Functional Assessment Average Pain 7   In the last MONTH (on 0-10 scale) has pain interfered with the following?  1. General activity like being  able to carry out your everyday physical activities such as walking, climbing stairs, carrying groceries, or moving a chair?  Rating(10)   +Driver, -BT, -Dye Allergies.

## 2020-12-23 NOTE — Telephone Encounter (Signed)
Pt was approve. JNG#[id number]

## 2020-12-23 NOTE — Patient Instructions (Signed)

## 2020-12-24 NOTE — Procedures (Signed)
Lumbar Epidural Steroid Injection - Interlaminar Approach with Fluoroscopic Guidance  Patient: Kimberly Bautista      Date of Birth: Oct 19, 1990 MRN: [id number] PCP: Pcp, No      Visit Date: 12/23/2020   Universal Protocol:     Consent Given By: the patient  Position: PRONE  Additional Comments: Vital signs were monitored before and after the procedure. Patient was prepped and draped in the usual sterile fashion. The correct patient, procedure, and site was verified.   Injection Procedure Details:   Procedure diagnoses: Lumbar radiculopathy [M54.16]   Meds Administered:  Meds ordered this encounter  Medications  . betamethasone acetate-betamethasone sodium phosphate (CELESTONE) injection 12 mg     Laterality: Right  Location/Site:  L5-S1  Needle: 3.5 in., 20 ga. Tuohy  Needle Placement: Paramedian epidural  Findings:   -Comments: Excellent flow of contrast into the epidural space.  Procedure Details: Using a paramedian approach from the side mentioned above, the region overlying the inferior lamina was localized under fluoroscopic visualization and the soft tissues overlying this structure were infiltrated with 4 ml. of 1% Lidocaine without Epinephrine. The Tuohy needle was inserted into the epidural space using a paramedian approach.   The epidural space was localized using loss of resistance along with counter oblique bi-planar fluoroscopic views.  After negative aspirate for air, blood, and CSF, a 2 ml. volume of Isovue-250 was injected into the epidural space and the flow of contrast was observed. Radiographs were obtained for documentation purposes.    The injectate was administered into the level noted above.   Additional Comments:  The patient tolerated the procedure well Dressing: 2 x 2 sterile gauze and Band-Aid    Post-procedure details: Patient was observed during the procedure. Post-procedure instructions were reviewed.  Patient left the clinic in  stable condition.

## 2020-12-24 NOTE — Progress Notes (Signed)
Kimberly Bautista - 30 y.o. female MRN [id number]  Date of birth: [birth year]  Office Visit Note: Visit Date: 12/23/2020 PCP: Pcp, No Referred by: No ref. provider found  Subjective: Chief Complaint  Patient presents with  . Lower Back - Pain  . Right Leg - Pain   HPI:  Kimberly Bautista is a 30 y.o. female who comes in today for planned repeat Right L5-S1 Lumbar epidural steroid injection with fluoroscopic guidance.  The patient has failed conservative care including home exercise, medications, time and activity modification.  This injection will be diagnostic and hopefully therapeutic.  Please see requesting physician notes for further details and justification.  MRI was central to right-sided protrusion at L5-S1 likely impacting the S1 nerve root.  Consistent symptoms and signs.  Patient got 90% relief for almost 3 months.  I will repeat the injection today.  Consideration for S1 transforaminal injection.  Consideration for microdiscectomy.  She will follow-up with Dr. Eunice Blase. Prior injection did offer more than 50% relief as noted above and did give the patient more functional ability for activities of daily living and was also beneficial in that it did reduce their medication requirement.  They have had physical therapy and continue with home exercises.  Current medication management is not beneficial in increasing their functional status.  Procedures are done as part of a comprehensive orthopedic and pain management program with access to in-house orthopedics, spine surgery and physical therapy as well as access to [facility] if needed.  Referring: Dr. Legrand Como Hilts    ROS Otherwise per HPI.  Assessment & Plan: Visit Diagnoses:    ICD-10-CM   1. Lumbar radiculopathy  M54.16 XR C-ARM NO REPORT    Epidural Steroid injection    betamethasone acetate-betamethasone sodium phosphate (CELESTONE) injection 12 mg    Plan: No additional findings.    Meds & Orders:  Meds ordered this encounter  Medications  . betamethasone acetate-betamethasone sodium phosphate (CELESTONE) injection 12 mg    Orders Placed This Encounter  Procedures  . XR C-ARM NO REPORT  . Epidural Steroid injection    Follow-up: Return for visit to requesting physician as needed.   Procedures: No procedures performed  Lumbar Epidural Steroid Injection - Interlaminar Approach with Fluoroscopic Guidance  Patient: Kimberly Bautista      Date of Birth: Jul 07, 1991 MRN: [id number] PCP: Pcp, No      Visit Date: 12/23/2020   Universal Protocol:     Consent Given By: the patient  Position: PRONE  Additional Comments: Vital signs were monitored before and after the procedure. Patient was prepped and draped in the usual sterile fashion. The correct patient, procedure, and site was verified.   Injection Procedure Details:   Procedure diagnoses: Lumbar radiculopathy [M54.16]   Meds Administered:  Meds ordered this encounter  Medications  . betamethasone acetate-betamethasone sodium phosphate (CELESTONE) injection 12 mg     Laterality: Right  Location/Site:  L5-S1  Needle: 3.5 in., 20 ga. Tuohy  Needle Placement: Paramedian epidural  Findings:   -Comments: Excellent flow of contrast into the epidural space.  Procedure Details: Using a paramedian approach from the side mentioned above, the region overlying the inferior lamina was localized under fluoroscopic visualization and the soft tissues overlying this structure were infiltrated with 4 ml. of 1% Lidocaine without Epinephrine. The Tuohy needle was inserted into the epidural space using a paramedian approach.   The epidural space was localized using loss of resistance along with counter oblique bi-planar  fluoroscopic views.  After negative aspirate for air, blood, and CSF, a 2 ml. volume of Isovue-250 was injected into the epidural space and the flow of contrast was observed. Radiographs were  obtained for documentation purposes.    The injectate was administered into the level noted above.   Additional Comments:  The patient tolerated the procedure well Dressing: 2 x 2 sterile gauze and Band-Aid    Post-procedure details: Patient was observed during the procedure. Post-procedure instructions were reviewed.  Patient left the clinic in stable condition.     Clinical History: MRI LUMBAR SPINE WITHOUT CONTRAST  TECHNIQUE: Multiplanar, multisequence MR imaging of the lumbar spine was performed. No intravenous contrast was administered.  COMPARISON:  None.  FINDINGS: Segmentation:  Standard.  Alignment:  Anteroposterior alignment is maintained.  Vertebrae: Vertebral body heights are maintained. No significant marrow edema. No suspicious osseous lesion.  Conus medullaris and cauda equina: Conus extends to the L2 level. Conus and cauda equina appear normal.  Paraspinal and other soft tissues: Unremarkable.  Disc levels:  L1-L2:  No stenosis.  L2-L3:  No stenosis.  L3-L4:  No stenosis.  L4-L5:  Disc desiccation.  Minimal disc bulge.  No stenosis.  L5-S1: Disc desiccation and mild height loss. Central disc protrusion flattening the ventral thecal sac. No canal stenosis. Disc contacts the traversing right S1 nerve root. No foraminal stenosis.  IMPRESSION: Lower lumbar degenerative changes. Most notably, a disc protrusion at L5-S1 contacts the traversing right S1 nerve root and may account for reported radiculopathy.   Electronically Signed   By: Macy Mis M.D.   On: 07/26/2020 09:41     Objective:  VS:  HT:    WT:   BMI:     BP:(!) 137/96  HR:80bpm  TEMP: ( )  RESP:  Physical Exam Vitals and nursing note reviewed.  Constitutional:      General: She is not in acute distress.    Appearance: Normal appearance. She is not ill-appearing.  HENT:     Head: Normocephalic and atraumatic.     Right Ear: External ear normal.      Left Ear: External ear normal.  Eyes:     Extraocular Movements: Extraocular movements intact.  Cardiovascular:     Rate and Rhythm: Normal rate.     Pulses: Normal pulses.  Pulmonary:     Effort: Pulmonary effort is normal. No respiratory distress.  Abdominal:     General: There is no distension.     Palpations: Abdomen is soft.  Musculoskeletal:        General: Tenderness present.     Cervical back: Neck supple.     Right lower leg: No edema.     Left lower leg: No edema.     Comments: Patient has good distal strength with no pain over the greater trochanters.  No clonus or focal weakness.  She has a positive slump test.  Dysesthesia in S1 distribution.  Skin:    Findings: No erythema, lesion or rash.  Neurological:     General: No focal deficit present.     Mental Status: She is alert and oriented to person, place, and time.     Sensory: No sensory deficit.     Motor: No weakness or abnormal muscle tone.     Coordination: Coordination normal.  Psychiatric:        Mood and Affect: Mood normal.        Behavior: Behavior normal.      Imaging: XR C-ARM NO  REPORT  Result Date: 12/23/2020 Please see Notes tab for imaging impression.

## 2021-01-03 ENCOUNTER — Encounter: Payer: Self-pay | Admitting: Physical Medicine and Rehabilitation

## 2021-01-20 ENCOUNTER — Telehealth: Payer: Self-pay | Admitting: Family Medicine

## 2021-01-20 DIAGNOSIS — M5126 Other intervertebral disc displacement, lumbar region: Secondary | ICD-10-CM

## 2021-01-20 MED ORDER — TRAMADOL HCL 50 MG PO TABS
50.0000 mg | ORAL_TABLET | Freq: Four times a day (QID) | ORAL | 0 refills | Status: DC | PRN
Start: 2021-01-20 — End: 2021-04-21

## 2021-01-20 NOTE — Telephone Encounter (Signed)
Patient called asked if she can get something called into the pharmacy for pain. Patient said she can not sit or stand because her back id hurting her so bad. Patient uses [facility] in [place]. The number to contact patient is [phone]

## 2021-01-20 NOTE — Telephone Encounter (Signed)
My Chart message sent

## 2021-01-20 NOTE — Telephone Encounter (Signed)
I called and spoke with Kimberly Bautista -- he just needed diagnosis. Advised that it is lumbar radiculopathy.

## 2021-01-20 NOTE — Telephone Encounter (Signed)
Denice Paradise (pharmacrist). Called asking for clarification about tramadol. Please call Denice Paradise back at [phone].

## 2021-01-21 ENCOUNTER — Encounter (HOSPITAL_COMMUNITY)
Admission: EM | Disposition: A | Discharge status: Home / Self Care | Payer: Self-pay | Source: Home / Self Care | Attending: Emergency Medicine

## 2021-01-21 ENCOUNTER — Ambulatory Visit: Payer: Commercial Managed Care - PPO | Admitting: Family Medicine

## 2021-01-21 ENCOUNTER — Other Ambulatory Visit: Payer: Self-pay

## 2021-01-21 ENCOUNTER — Emergency Department (HOSPITAL_COMMUNITY): Payer: Commercial Managed Care - PPO | Admitting: Anesthesiology

## 2021-01-21 ENCOUNTER — Emergency Department (HOSPITAL_COMMUNITY): Payer: Commercial Managed Care - PPO

## 2021-01-21 ENCOUNTER — Encounter (HOSPITAL_COMMUNITY): Payer: Self-pay

## 2021-01-21 ENCOUNTER — Ambulatory Visit (HOSPITAL_COMMUNITY)
Admission: EM | Admit: 2021-01-21 | Discharge: 2021-01-22 | Disposition: A | Discharge status: Home / Self Care | Payer: Commercial Managed Care - PPO | Attending: Neurosurgery | Admitting: Neurosurgery

## 2021-01-21 DIAGNOSIS — Z20822 Contact with and (suspected) exposure to covid-19: Secondary | ICD-10-CM | POA: Diagnosis not present

## 2021-01-21 DIAGNOSIS — F17200 Nicotine dependence, unspecified, uncomplicated: Secondary | ICD-10-CM | POA: Insufficient documentation

## 2021-01-21 DIAGNOSIS — M5126 Other intervertebral disc displacement, lumbar region: Secondary | ICD-10-CM | POA: Diagnosis present

## 2021-01-21 DIAGNOSIS — R338 Other retention of urine: Secondary | ICD-10-CM | POA: Diagnosis not present

## 2021-01-21 DIAGNOSIS — Z6837 Body mass index (BMI) 37.0-37.9, adult: Secondary | ICD-10-CM | POA: Diagnosis not present

## 2021-01-21 DIAGNOSIS — M5117 Intervertebral disc disorders with radiculopathy, lumbosacral region: Secondary | ICD-10-CM | POA: Insufficient documentation

## 2021-01-21 DIAGNOSIS — Z880 Allergy status to penicillin: Secondary | ICD-10-CM | POA: Insufficient documentation

## 2021-01-21 DIAGNOSIS — M545 Low back pain, unspecified: Secondary | ICD-10-CM

## 2021-01-21 DIAGNOSIS — Z419 Encounter for procedure for purposes other than remedying health state, unspecified: Secondary | ICD-10-CM

## 2021-01-21 HISTORY — PX: LUMBAR LAMINECTOMY/DECOMPRESSION MICRODISCECTOMY: SHX5026

## 2021-01-21 LAB — CBC WITH DIFFERENTIAL/PLATELET
Abs Immature Granulocytes: 0.06 10*3/uL (ref 0.00–0.07)
Basophils Absolute: 0.1 10*3/uL (ref 0.0–0.1)
Basophils Relative: 1 %
Eosinophils Absolute: 0.2 10*3/uL (ref 0.0–0.5)
Eosinophils Relative: 2 %
HCT: 46.3 % — ABNORMAL HIGH (ref 36.0–46.0)
Hemoglobin: 14.9 g/dL (ref 12.0–15.0)
Immature Granulocytes: 1 %
Lymphocytes Relative: 28 %
Lymphs Abs: 2.7 10*3/uL (ref 0.7–4.0)
MCH: 30 pg (ref 26.0–34.0)
MCHC: 32.2 g/dL (ref 30.0–36.0)
MCV: 93.2 fL (ref 80.0–100.0)
Monocytes Absolute: 0.5 10*3/uL (ref 0.1–1.0)
Monocytes Relative: 5 %
Neutro Abs: 6 10*3/uL (ref 1.7–7.7)
Neutrophils Relative %: 63 %
Platelets: 285 10*3/uL (ref 150–400)
RBC: 4.97 MIL/uL (ref 3.87–5.11)
RDW: 12.8 % (ref 11.5–15.5)
WBC: 9.6 10*3/uL (ref 4.0–10.5)
nRBC: 0 % (ref 0.0–0.2)

## 2021-01-21 LAB — BASIC METABOLIC PANEL
Anion gap: 9 (ref 5–15)
BUN: 11 mg/dL (ref 6–20)
CO2: 24 mmol/L (ref 22–32)
Calcium: 9 mg/dL (ref 8.9–10.3)
Chloride: 106 mmol/L (ref 98–111)
Creatinine, Ser: 0.88 mg/dL (ref 0.44–1.00)
GFR, Estimated: >60 mL/min (ref >60)
Glucose, Bld: 90 mg/dL (ref 70–99)
Potassium: 3.5 mmol/L (ref 3.5–5.1)
Sodium: 139 mmol/L (ref 135–145)

## 2021-01-21 LAB — URINALYSIS, ROUTINE W REFLEX MICROSCOPIC
Bilirubin Urine: NEGATIVE
Glucose, UA: NEGATIVE mg/dL
Hgb urine dipstick: NEGATIVE
Ketones, ur: NEGATIVE mg/dL
Leukocytes,Ua: NEGATIVE
Nitrite: NEGATIVE
Protein, ur: NEGATIVE mg/dL
Specific Gravity, Urine: 1.027 (ref 1.005–1.030)
pH: 5 (ref 5.0–8.0)

## 2021-01-21 LAB — I-STAT BETA HCG BLOOD, ED (MC, WL, AP ONLY): I-stat hCG, quantitative: <5 m[IU]/mL (ref <5)

## 2021-01-21 LAB — RESP PANEL BY RT-PCR (FLU A&B, COVID) ARPGX2
Influenza A by PCR: NEGATIVE
Influenza B by PCR: NEGATIVE
SARS Coronavirus 2 by RT PCR: NEGATIVE

## 2021-01-21 LAB — SURGICAL PCR SCREEN
MRSA, PCR: NEGATIVE
Staphylococcus aureus: NEGATIVE

## 2021-01-21 IMAGING — MR MR LUMBAR SPINE W/O CM
4 of 5 series · 19 of 48 positions shown · non-contrast
Comparison: MRI lumbar spine [DATE]

CLINICAL DATA: Low back pain.  Rule out cauda equina syndrome.

EXAM:
MRI LUMBAR SPINE WITHOUT CONTRAST
TECHNIQUE: Multiplanar, multisequence MR imaging of the lumbar spine was
performed. No intravenous contrast was administered.

[Series 11: T2 · sagittal · 4.0mm · 0.55mm/px · 6 of 16 slices shown (1 of 2)]
[im 1/16]
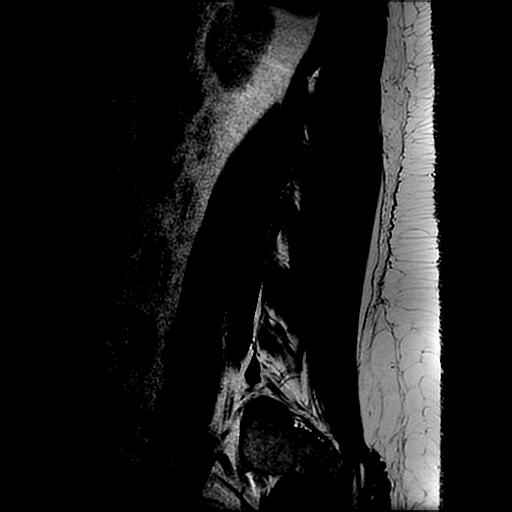
[im 4/16]
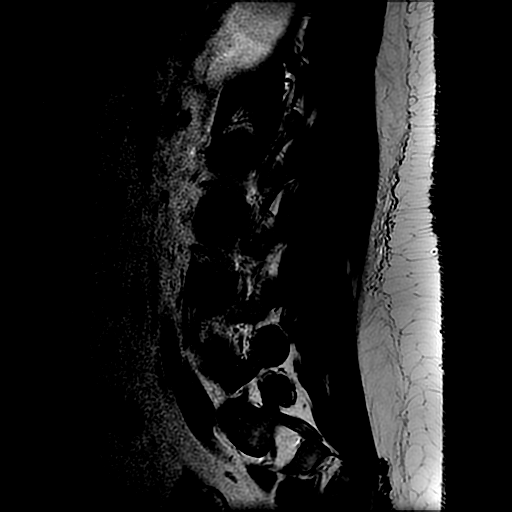
[im 7/16]
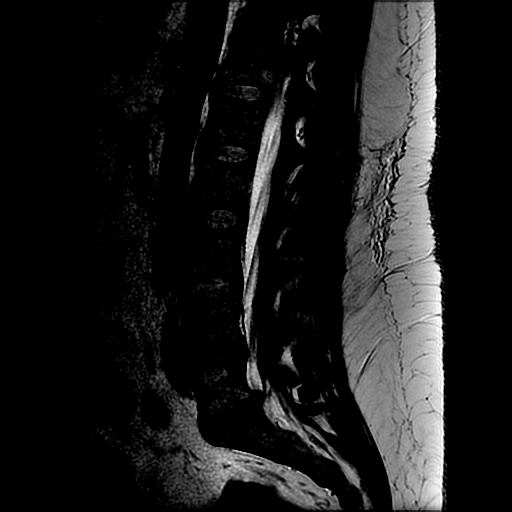
[im 10/16]
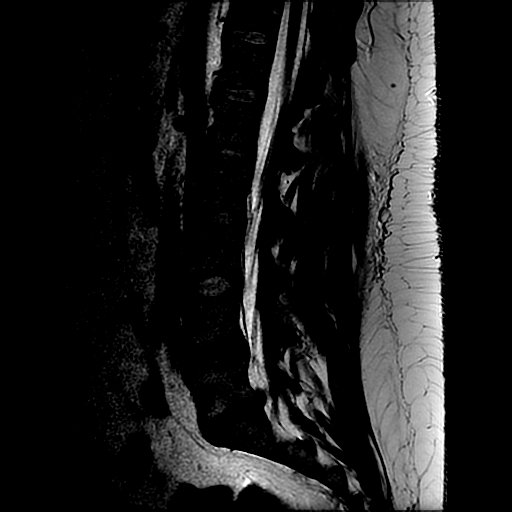
[im 13/16]
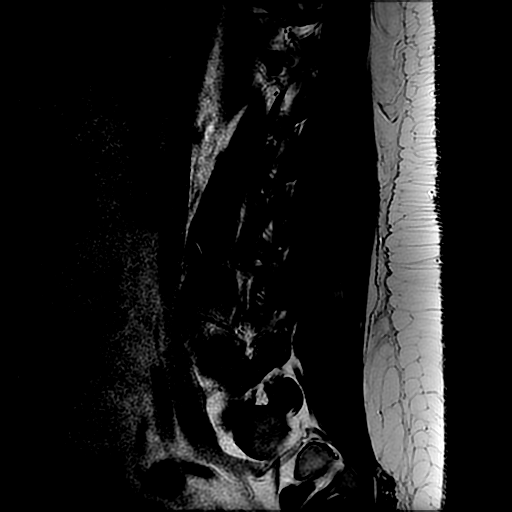
[im 16/16]
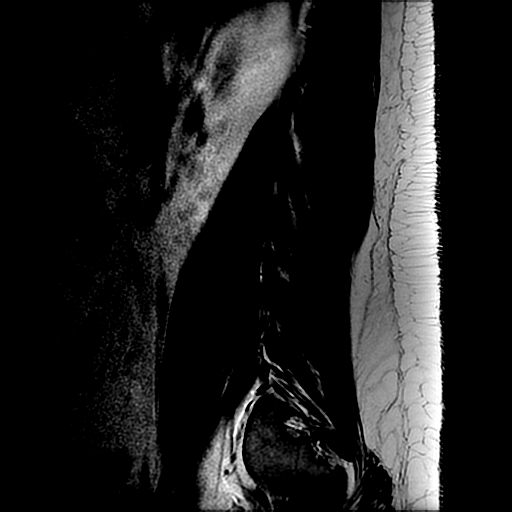

[Series 13: T1 · sagittal · 4.0mm · 0.55mm/px · 3 of 16 slices shown (1 of 2)]
[im 4/16]
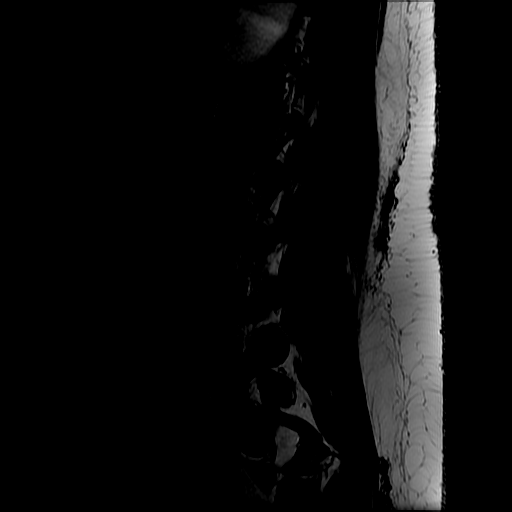
[im 10/16]
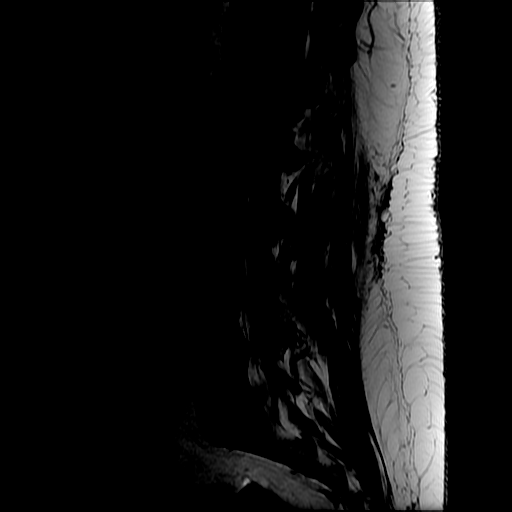
[im 16/16]
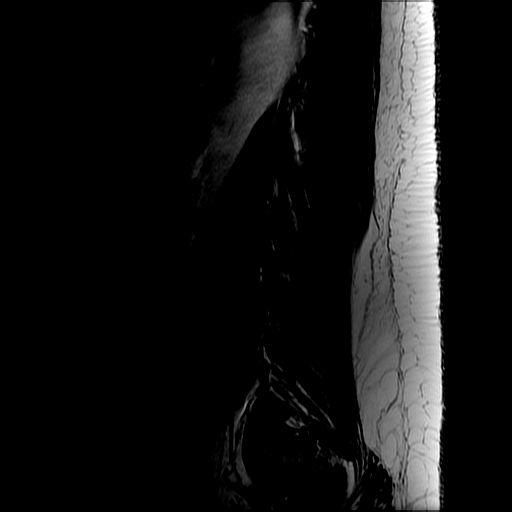

[Series 14: T2 · axial · 4.0mm · 0.39mm/px · z∈[-546,-371]mm · 7 of 38 slices shown (2 of 2)]
[im 1/38]
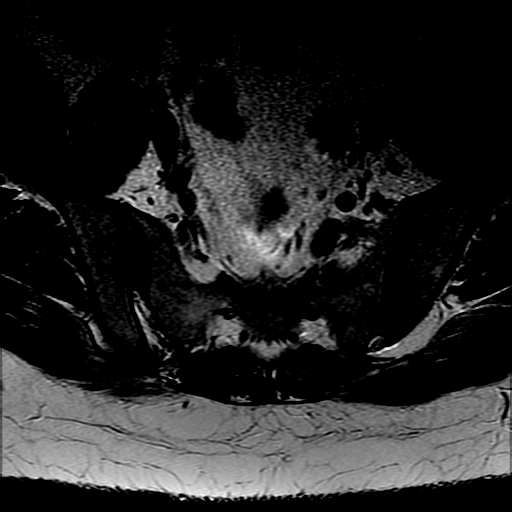
[im 6/38]
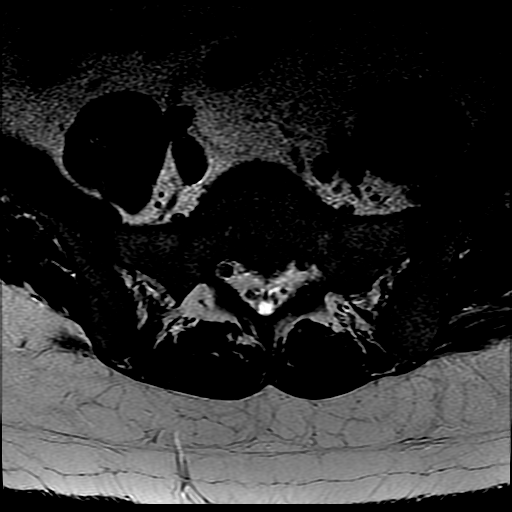
[im 11/38]
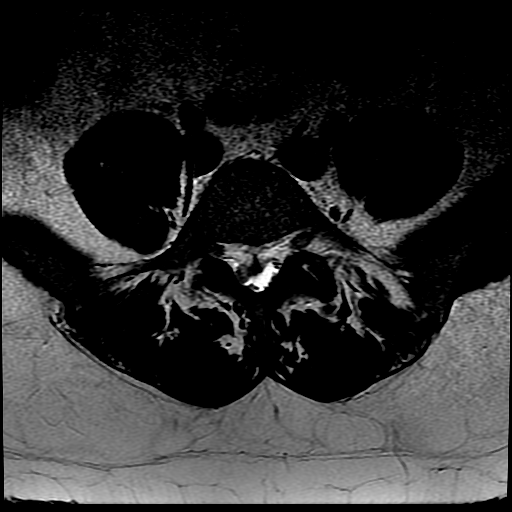
[im 16/38]
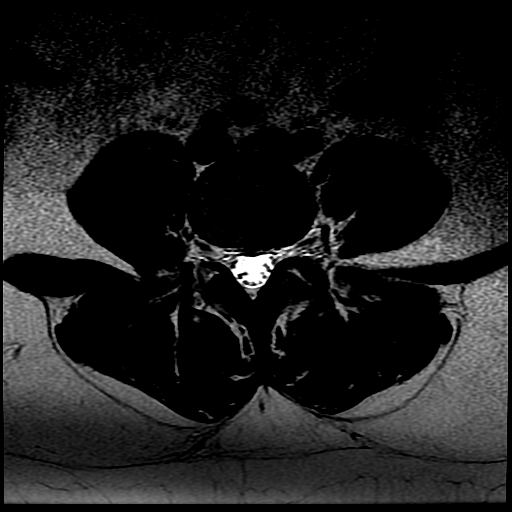
[im 19/38]
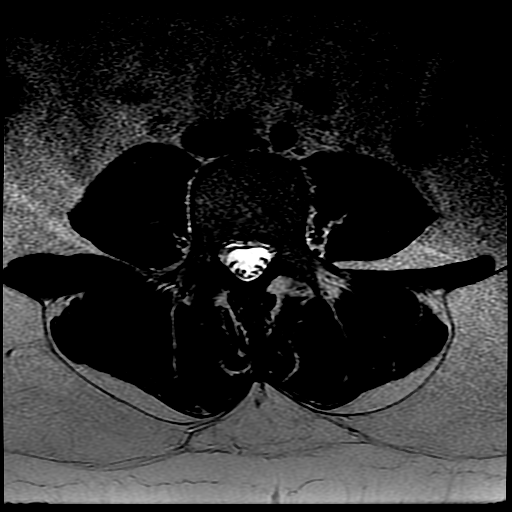
[im 22/38]
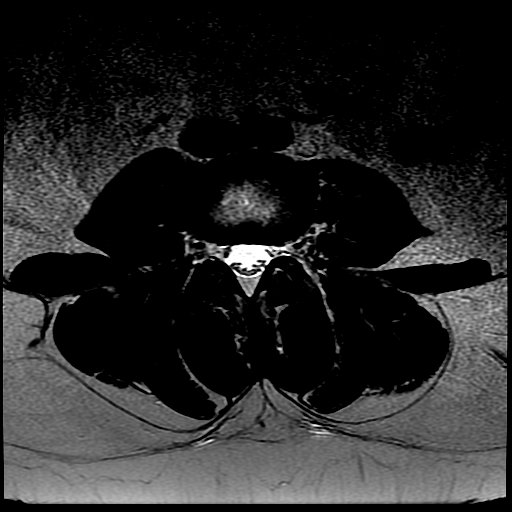
[im 32/38]
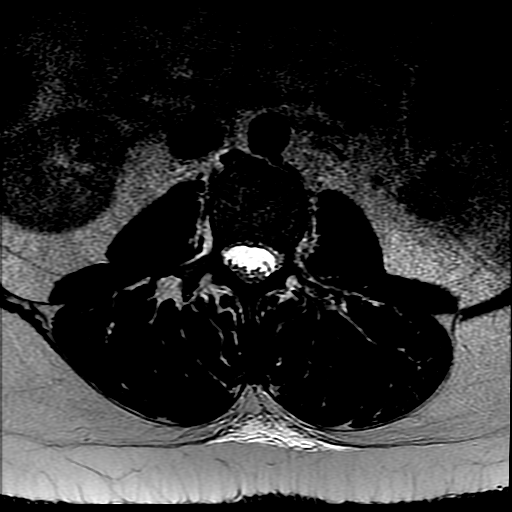

[Series 15: T1 · axial · 4.0mm · 0.39mm/px · z∈[-522,-371]mm · 3 of 38 slices shown (2 of 2)]
[im 6/38]
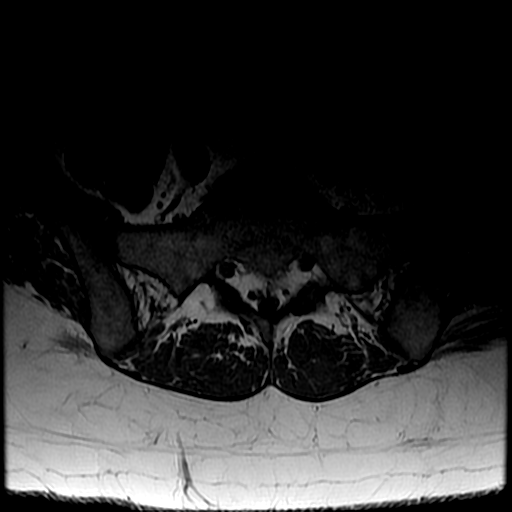
[im 19/38]
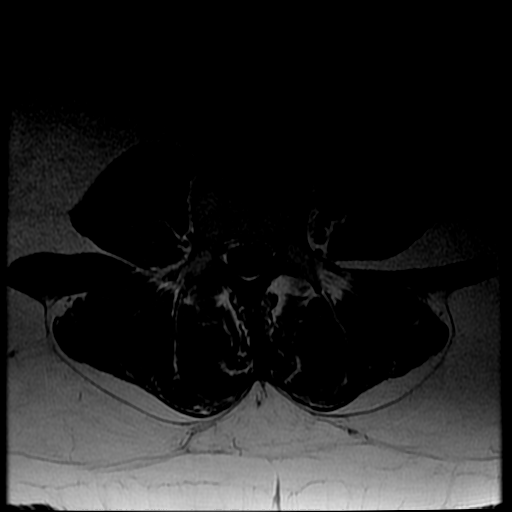
[im 32/38]
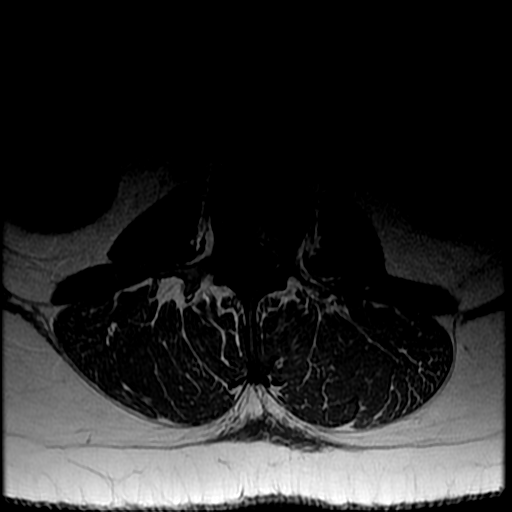

[19 of 48 positions shown; findings below may reference images not displayed]

FINDINGS: Segmentation:  Normal

Alignment:  Normal

Vertebrae:  Normal bone marrow. Negative for fracture or mass.

Conus medullaris and cauda equina: Conus extends to the L1-2 level.
Conus and cauda equina appear normal.

Paraspinal and other soft tissues: Negative for paraspinous mass or
adenopathy.

Disc levels:

L1-2: Negative

L2-3: Negative

L3-4: Negative

L4-5: Small central disc protrusion unchanged from the prior study.
Mild facet degeneration. Negative for neural impingement.

L5-S1: Large extruded disc fragment on the right with significant
progression from the prior MRI. There is compression of thecal sac
and compression of the right S1 nerve root.
IMPRESSION: Small central disc protrusion L4-5 unchanged without neural
impingement

Large extruded disc fragment on the right L5-S1 has progressed
significantly since the prior study. This is causing spinal stenosis
and right S1 nerve root compression.

## 2021-01-21 IMAGING — MR MR THORACIC SPINE W/O CM
4 of 6 series · 19 of 48 positions shown · non-contrast
Comparison: None.

CLINICAL DATA: Back pain.  Suspect cauda equina syndrome.

EXAM:
MRI THORACIC SPINE WITHOUT CONTRAST
TECHNIQUE: Multiplanar, multisequence MR imaging of the thoracic spine was
performed. No intravenous contrast was administered.

[Series 2: counting loc · sagittal · 4.0mm · 0.90mm/px · 3 of 10 slices shown]
[im 1/10]
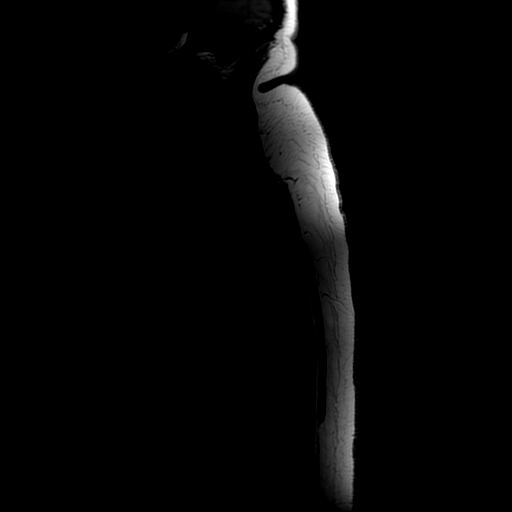
[im 7/10]
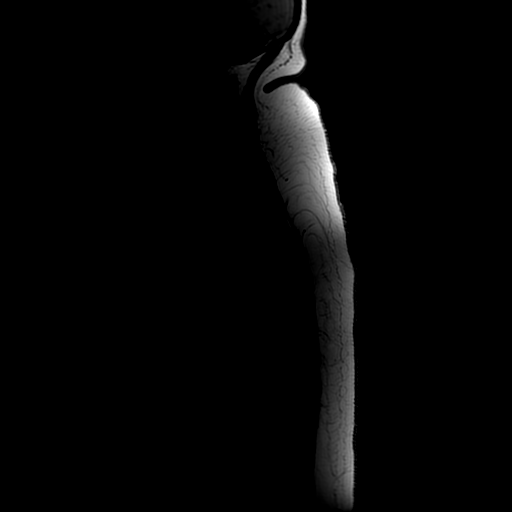
[im 10/10]
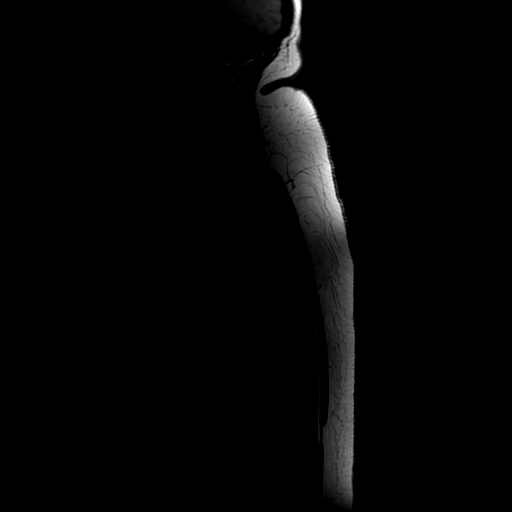

[Series 4: T2 · sagittal · 3.0mm · 0.62mm/px · 6 of 18 slices shown (1 of 2)]
[im 1/18]
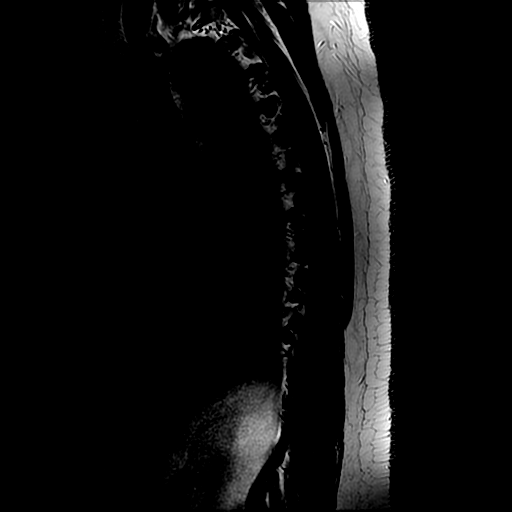
[im 4/18]
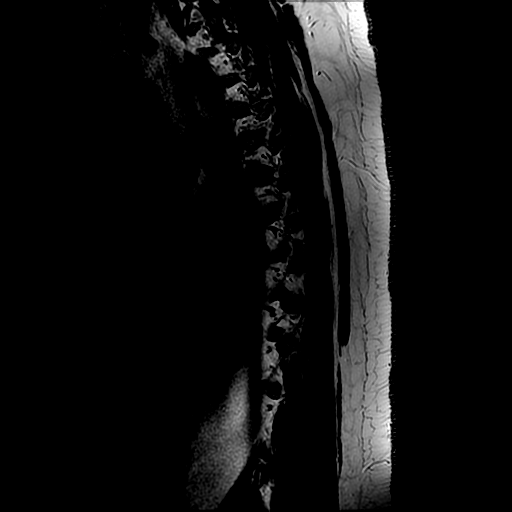
[im 7/18]
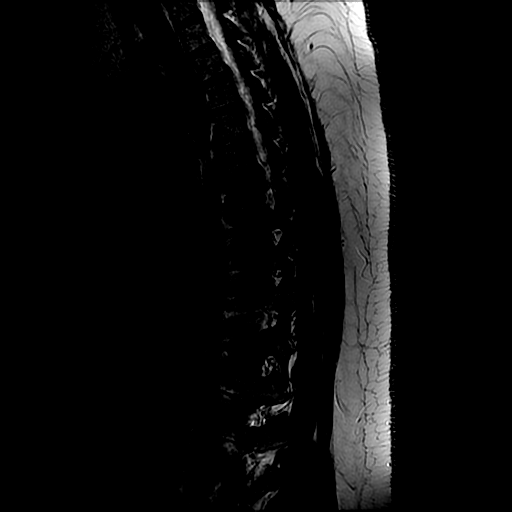
[im 11/18]
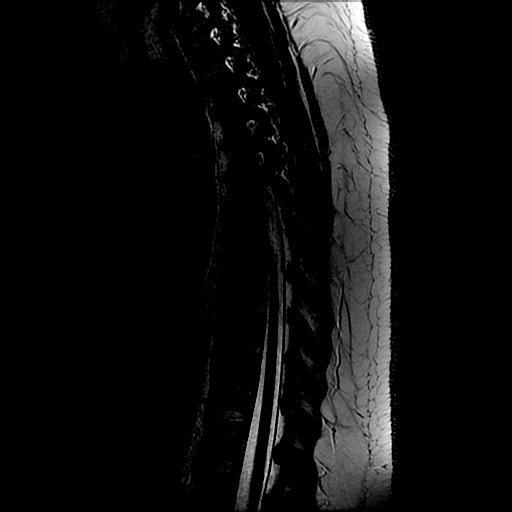
[im 14/18]
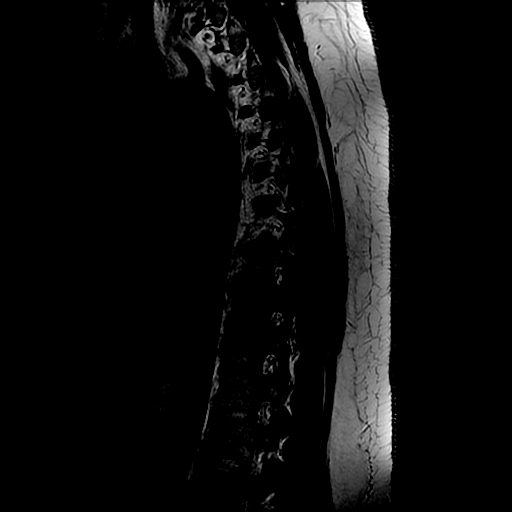
[im 18/18]
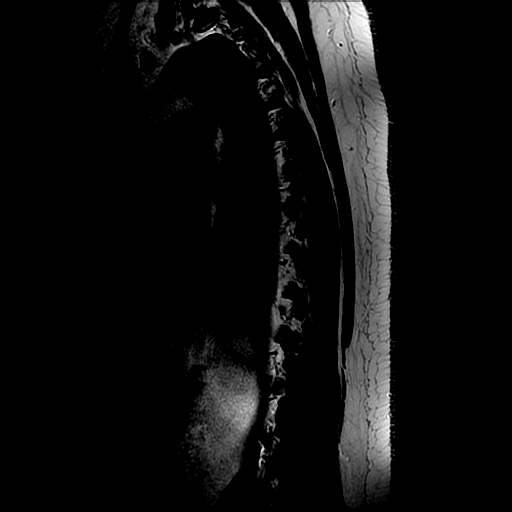

[Series 7: T1 · sagittal · 3.0mm · 0.62mm/px · 3 of 18 slices shown]
[im 4/18]
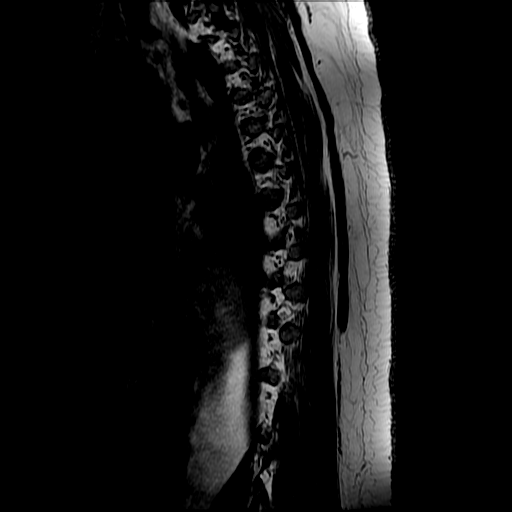
[im 11/18]
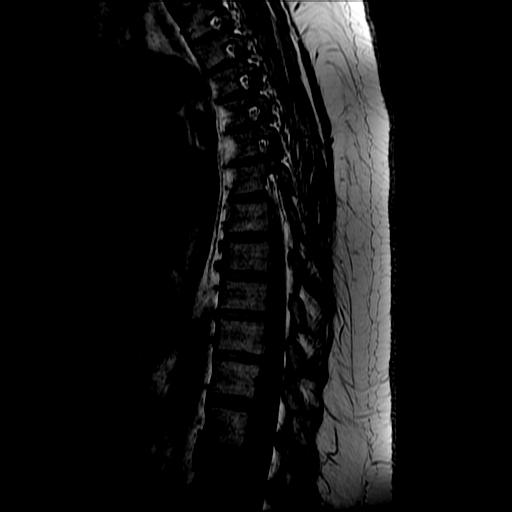
[im 18/18]
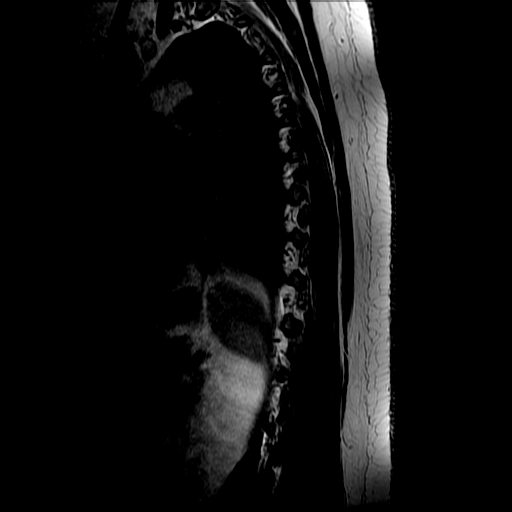

[Series 8: T2 · axial · 4.0mm · 0.43mm/px · z∈[-321,-130]mm · 7 of 36 slices shown (2 of 2)]
[im 1/36]
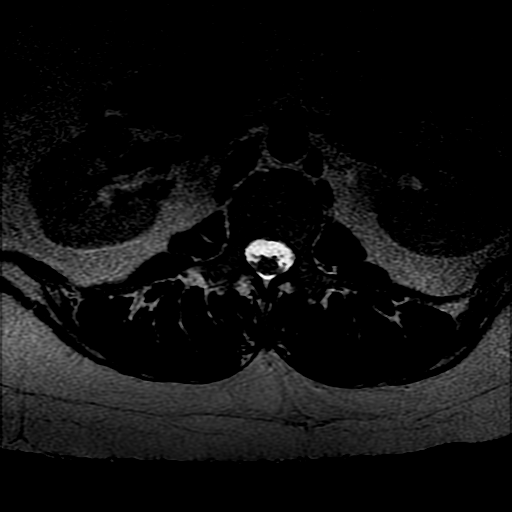
[im 6/36]
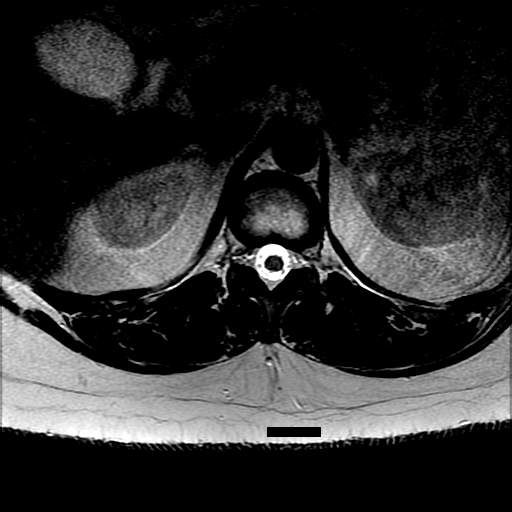
[im 12/36]
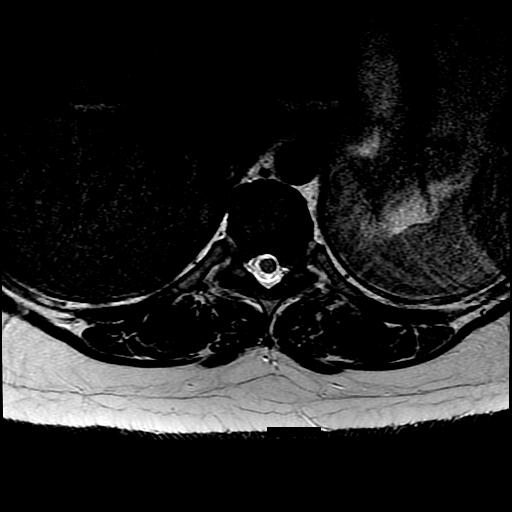
[im 15/36]
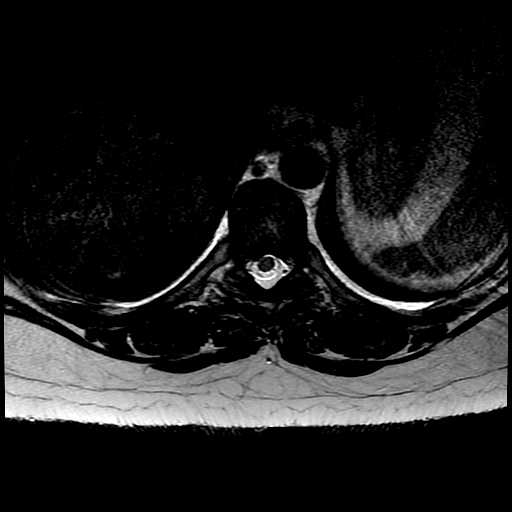
[im 18/36]
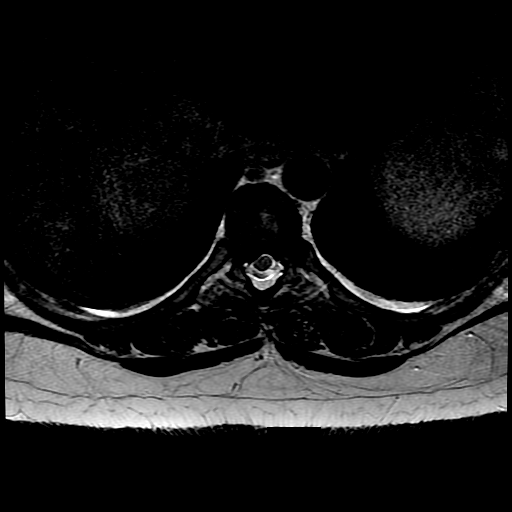
[im 21/36]
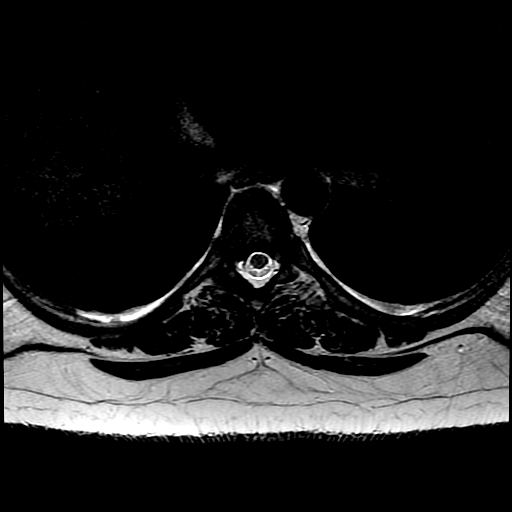
[im 30/36]
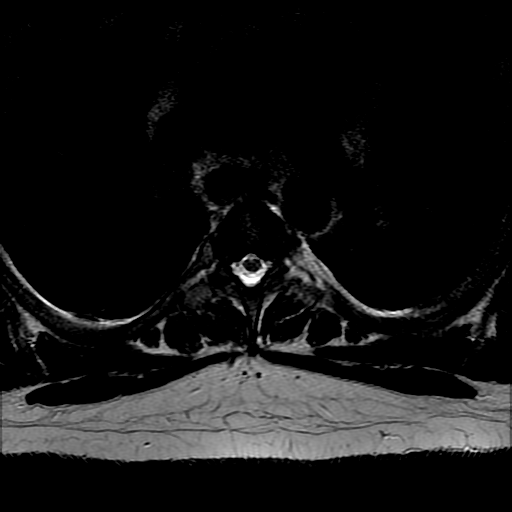

[19 of 48 positions shown; findings below may reference images not displayed]

FINDINGS: Alignment:  Normal

Vertebrae: Normal bone marrow.  Negative for fracture or mass.

Cord:  Normal signal and morphology.

Paraspinal and other soft tissues: Negative for paraspinous mass or
edema.

Disc levels:

Abnormal disc spaces described below.

T2-3: Small right-sided disc and osteophyte without stenosis.

T4-5: Small bilateral disc protrusions without significant stenosis

T5-6: Small right-sided disc protrusion without significant
stenosis.

T6-7: Small right-sided disc protrusion

T7-8: Small right-sided disc protrusion with mild cord flattening on
the right

T9-10: Small left foraminal disc protrusion without significant
stenosis.
IMPRESSION: Mild thoracic degenerative change. Multiple small disc protrusions
in the thoracic spine without significant neural impingement.
Negative for cord compression.

## 2021-01-21 IMAGING — CR DG LUMBAR SPINE 2-3V
1 series · 1 of 1 positions shown · non-contrast
Comparison: None.

CLINICAL DATA: Localization for L5-S1 micro discectomy.

EXAM:
LUMBAR SPINE - 2-3 VIEW

[xtable lateral]
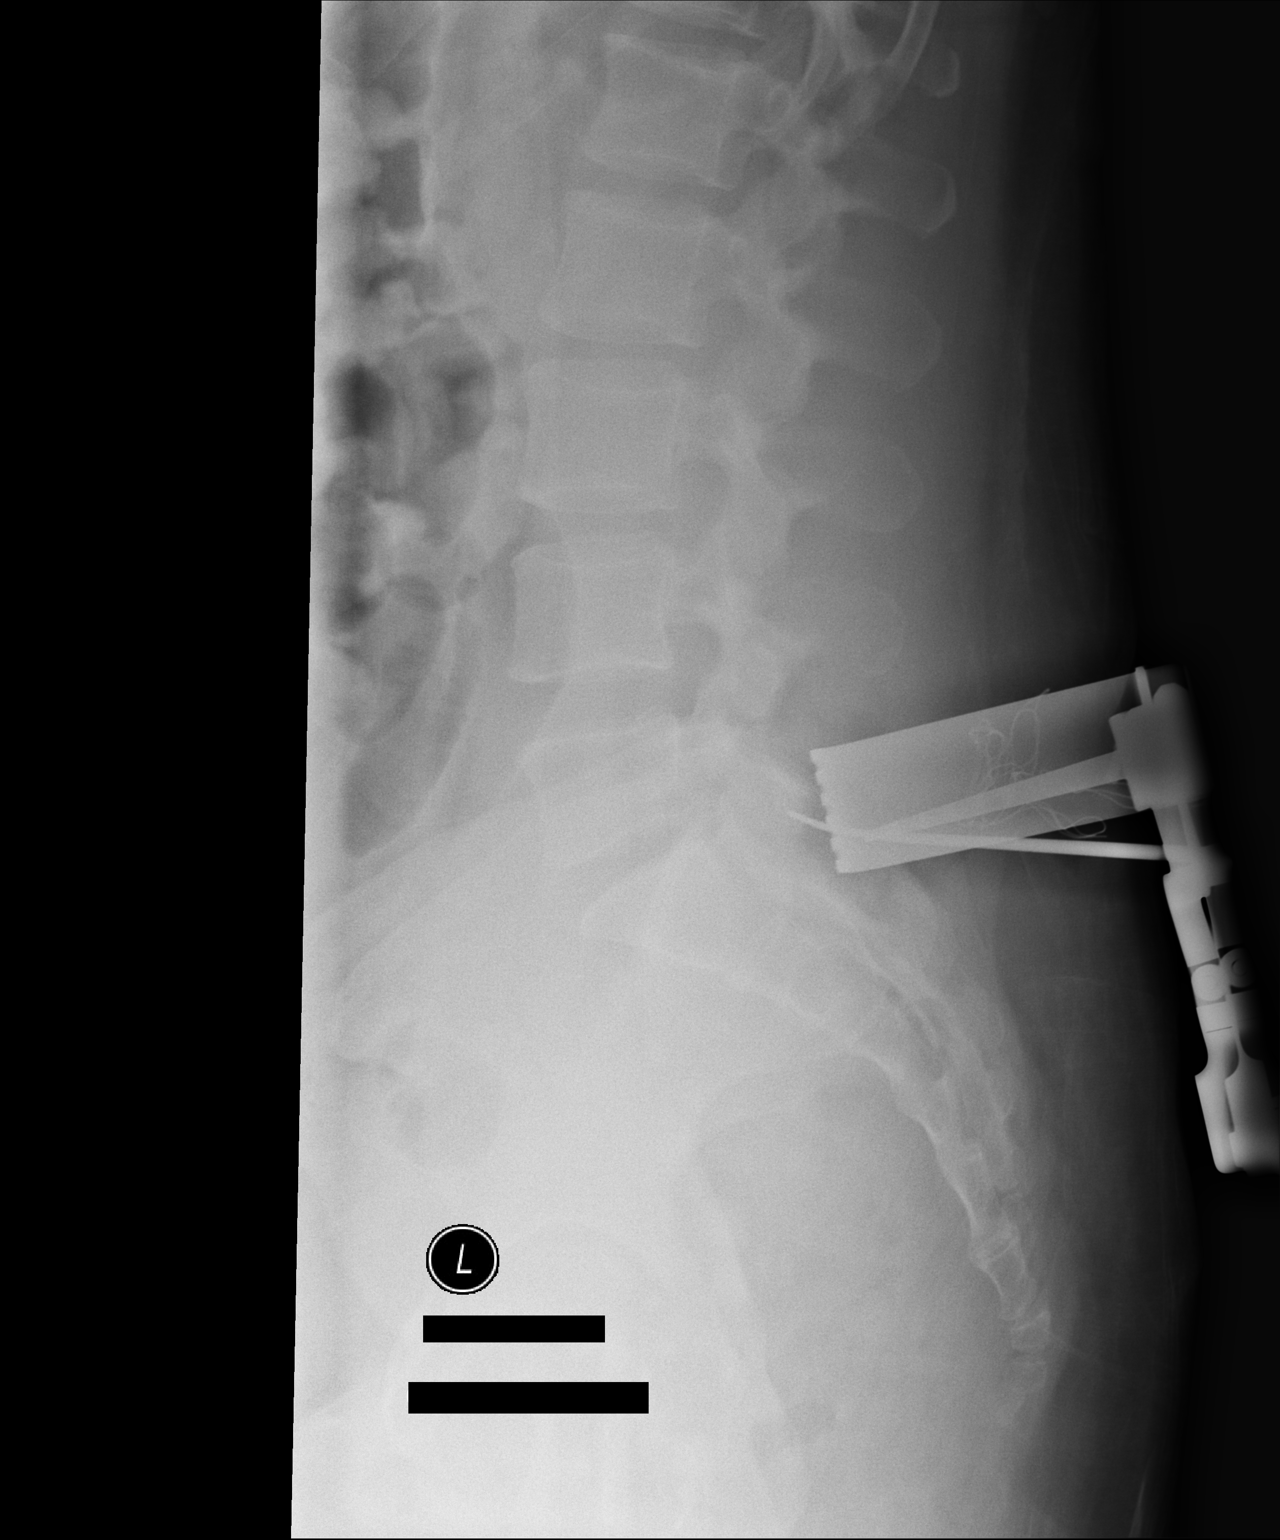

[1 of 1 positions shown; findings below may reference images not displayed]

FINDINGS: Two portable cross-table lateral views of the lumbar spine obtained
in the operating room. Initial image demonstrates surgical
instrument localizing posterior to L5-S1 in the soft tissues.
Subsequent image demonstrates retractors and surgical instrument at
the L5-S1 posterior elements.
IMPRESSION: Intraoperative localization with surgical instruments posterior to
L5-S1.

## 2021-01-21 SURGERY — LUMBAR LAMINECTOMY/DECOMPRESSION MICRODISCECTOMY 1 LEVEL
Anesthesia: General | Site: Spine Lumbar | Laterality: Right

## 2021-01-21 MED ORDER — PANTOPRAZOLE SODIUM 40 MG IV SOLR: 40.0000 mg | Freq: Every day | INTRAVENOUS | Status: DC

## 2021-01-21 MED ORDER — CHLORHEXIDINE GLUCONATE 0.12 % MT SOLN
OROMUCOSAL | Status: AC
  Administered 2021-01-21: 15 mL
  Filled 2021-01-21: qty 15

## 2021-01-21 MED ORDER — ACETAMINOPHEN 10 MG/ML IV SOLN
INTRAVENOUS | Status: AC
  Filled 2021-01-21: qty 100

## 2021-01-21 MED ORDER — SUGAMMADEX SODIUM 200 MG/2ML IV SOLN
INTRAVENOUS | Status: DC | PRN
  Administered 2021-01-21: 300 mg via INTRAVENOUS

## 2021-01-21 MED ORDER — FENTANYL CITRATE (PF) 250 MCG/5ML IJ SOLN
INTRAMUSCULAR | Status: DC | PRN
  Administered 2021-01-21: 100 ug via INTRAVENOUS
  Administered 2021-01-21: 50 ug via INTRAVENOUS
  Administered 2021-01-21: 100 ug via INTRAVENOUS

## 2021-01-21 MED ORDER — LIDOCAINE 2% (20 MG/ML) 5 ML SYRINGE
INTRAMUSCULAR | Status: AC
  Filled 2021-01-21: qty 5

## 2021-01-21 MED ORDER — VANCOMYCIN HCL IN DEXTROSE 1-5 GM/200ML-% IV SOLN
INTRAVENOUS | Status: AC
  Administered 2021-01-21: 1000 mg via INTRAVENOUS
  Filled 2021-01-21: qty 200

## 2021-01-21 MED ORDER — LIDOCAINE HCL (CARDIAC) PF 100 MG/5ML IV SOSY
PREFILLED_SYRINGE | INTRAVENOUS | Status: DC | PRN
  Administered 2021-01-21: 60 mg via INTRAVENOUS

## 2021-01-21 MED ORDER — LIDOCAINE-EPINEPHRINE 1 %-1:100000 IJ SOLN
INTRAMUSCULAR | Status: DC | PRN
  Administered 2021-01-21: 5 mL

## 2021-01-21 MED ORDER — ROCURONIUM 10MG/ML (10ML) SYRINGE FOR MEDFUSION PUMP - OPTIME
INTRAVENOUS | Status: DC | PRN
  Administered 2021-01-21: 30 mg via INTRAVENOUS
  Administered 2021-01-21: 50 mg via INTRAVENOUS

## 2021-01-21 MED ORDER — PHENOL 1.4 % MT LIQD: 1.0000 | OROMUCOSAL | Status: DC | PRN

## 2021-01-21 MED ORDER — LORATADINE 10 MG PO TABS: 10.0000 mg | ORAL_TABLET | Freq: Every day | ORAL | Status: DC

## 2021-01-21 MED ORDER — MORPHINE SULFATE (PF) 4 MG/ML IV SOLN
4.0000 mg | Freq: Once | INTRAVENOUS | Status: AC
  Administered 2021-01-21: 4 mg via INTRAVENOUS
  Filled 2021-01-21: qty 1

## 2021-01-21 MED ORDER — ACETAMINOPHEN 650 MG RE SUPP
650.0000 mg | RECTAL | Status: DC | PRN
Start: 2021-01-21 — End: 2021-01-22

## 2021-01-21 MED ORDER — ORAL CARE MOUTH RINSE: 15.0000 mL | Freq: Once | OROMUCOSAL | Status: DC

## 2021-01-21 MED ORDER — PANTOPRAZOLE SODIUM 40 MG PO TBEC
40.0000 mg | DELAYED_RELEASE_TABLET | Freq: Every day | ORAL | Status: DC
  Administered 2021-01-22: 40 mg via ORAL
  Filled 2021-01-21: qty 1

## 2021-01-21 MED ORDER — DEXAMETHASONE SODIUM PHOSPHATE 10 MG/ML IJ SOLN
INTRAMUSCULAR | Status: DC | PRN
  Administered 2021-01-21: 10 mg via INTRAVENOUS

## 2021-01-21 MED ORDER — BUPIVACAINE HCL (PF) 0.25 % IJ SOLN
INTRAMUSCULAR | Status: AC
  Filled 2021-01-21: qty 30

## 2021-01-21 MED ORDER — ALUM & MAG HYDROXIDE-SIMETH 200-200-20 MG/5ML PO SUSP: 30.0000 mL | Freq: Four times a day (QID) | ORAL | Status: DC | PRN

## 2021-01-21 MED ORDER — ACETAMINOPHEN 325 MG PO TABS: 650.0000 mg | ORAL_TABLET | ORAL | Status: DC | PRN

## 2021-01-21 MED ORDER — THROMBIN 5000 UNITS EX SOLR
CUTANEOUS | Status: AC
  Filled 2021-01-21: qty 10000

## 2021-01-21 MED ORDER — FENTANYL CITRATE (PF) 250 MCG/5ML IJ SOLN
INTRAMUSCULAR | Status: AC
  Filled 2021-01-21: qty 5

## 2021-01-21 MED ORDER — 0.9 % SODIUM CHLORIDE (POUR BTL) OPTIME
TOPICAL | Status: DC | PRN
  Administered 2021-01-21: 1000 mL

## 2021-01-21 MED ORDER — ONDANSETRON 4 MG PO TBDP: 4.0000 mg | ORAL_TABLET | Freq: Three times a day (TID) | ORAL | Status: DC | PRN

## 2021-01-21 MED ORDER — LIDOCAINE-EPINEPHRINE 1 %-1:100000 IJ SOLN
INTRAMUSCULAR | Status: AC
  Filled 2021-01-21: qty 1

## 2021-01-21 MED ORDER — PROPOFOL 10 MG/ML IV BOLUS
INTRAVENOUS | Status: DC | PRN
  Administered 2021-01-21: 130 mg via INTRAVENOUS
  Administered 2021-01-21: 40 mg via INTRAVENOUS

## 2021-01-21 MED ORDER — TRAMADOL HCL 50 MG PO TABS: 50.0000 mg | ORAL_TABLET | Freq: Four times a day (QID) | ORAL | Status: DC | PRN

## 2021-01-21 MED ORDER — SODIUM CHLORIDE 0.9% FLUSH
3.0000 mL | Freq: Two times a day (BID) | INTRAVENOUS | Status: DC
  Administered 2021-01-22: 3 mL via INTRAVENOUS

## 2021-01-21 MED ORDER — THROMBIN 5000 UNITS EX SOLR
CUTANEOUS | Status: DC | PRN
  Administered 2021-01-21 (×2): 5000 [IU] via TOPICAL

## 2021-01-21 MED ORDER — BUPIVACAINE HCL (PF) 0.25 % IJ SOLN
INTRAMUSCULAR | Status: DC | PRN
  Administered 2021-01-21: 5 mL

## 2021-01-21 MED ORDER — VANCOMYCIN HCL IN DEXTROSE 1-5 GM/200ML-% IV SOLN: 1000.0000 mg | Freq: Once | INTRAVENOUS | Status: AC

## 2021-01-21 MED ORDER — MIDAZOLAM HCL 2 MG/2ML IJ SOLN
INTRAMUSCULAR | Status: AC
  Filled 2021-01-21: qty 2

## 2021-01-21 MED ORDER — CHLORHEXIDINE GLUCONATE 0.12 % MT SOLN: 15.0000 mL | Freq: Once | OROMUCOSAL | Status: DC

## 2021-01-21 MED ORDER — DEXAMETHASONE SODIUM PHOSPHATE 10 MG/ML IJ SOLN
10.0000 mg | Freq: Once | INTRAMUSCULAR | Status: AC
  Administered 2021-01-21: 10 mg via INTRAVENOUS
  Filled 2021-01-21: qty 1

## 2021-01-21 MED ORDER — PROPOFOL 10 MG/ML IV BOLUS
INTRAVENOUS | Status: AC
  Filled 2021-01-21: qty 20

## 2021-01-21 MED ORDER — FAMOTIDINE 20 MG PO TABS: 20.0000 mg | ORAL_TABLET | Freq: Two times a day (BID) | ORAL | Status: DC

## 2021-01-21 MED ORDER — ONDANSETRON HCL 4 MG/2ML IJ SOLN
INTRAMUSCULAR | Status: AC
  Filled 2021-01-21: qty 2

## 2021-01-21 MED ORDER — ONDANSETRON HCL 4 MG/2ML IJ SOLN: 4.0000 mg | Freq: Four times a day (QID) | INTRAMUSCULAR | Status: DC | PRN

## 2021-01-21 MED ORDER — MENTHOL 3 MG MT LOZG: 1.0000 | LOZENGE | OROMUCOSAL | Status: DC | PRN

## 2021-01-21 MED ORDER — SODIUM CHLORIDE 0.9% FLUSH: 3.0000 mL | INTRAVENOUS | Status: DC | PRN

## 2021-01-21 MED ORDER — ONDANSETRON HCL 4 MG PO TABS: 4.0000 mg | ORAL_TABLET | Freq: Four times a day (QID) | ORAL | Status: DC | PRN

## 2021-01-21 MED ORDER — OXYCODONE HCL 5 MG PO TABS
10.0000 mg | ORAL_TABLET | ORAL | Status: DC | PRN
  Administered 2021-01-21 – 2021-01-22 (×3): 10 mg via ORAL
  Filled 2021-01-21 (×2): qty 2

## 2021-01-21 MED ORDER — VANCOMYCIN HCL 500 MG/100ML IV SOLN
500.0000 mg | Freq: Once | INTRAVENOUS | Status: AC
  Administered 2021-01-21: 500 mg via INTRAVENOUS
  Filled 2021-01-21: qty 100

## 2021-01-21 MED ORDER — CYCLOBENZAPRINE HCL 10 MG PO TABS
10.0000 mg | ORAL_TABLET | Freq: Three times a day (TID) | ORAL | Status: DC | PRN
  Administered 2021-01-21 – 2021-01-22 (×2): 10 mg via ORAL
  Filled 2021-01-21 (×3): qty 1

## 2021-01-21 MED ORDER — HYDROMORPHONE HCL 1 MG/ML IJ SOLN
0.2500 mg | INTRAMUSCULAR | Status: DC | PRN
  Administered 2021-01-21 (×2): 0.5 mg via INTRAVENOUS

## 2021-01-21 MED ORDER — HYDROMORPHONE HCL 1 MG/ML IJ SOLN
INTRAMUSCULAR | Status: AC
  Filled 2021-01-21: qty 1

## 2021-01-21 MED ORDER — LACTATED RINGERS IV SOLN: INTRAVENOUS | Status: DC | PRN

## 2021-01-21 MED ORDER — HEMOSTATIC AGENTS (NO CHARGE) OPTIME
TOPICAL | Status: DC | PRN
  Administered 2021-01-21: 1 via TOPICAL

## 2021-01-21 MED ORDER — ROCURONIUM BROMIDE 10 MG/ML (PF) SYRINGE
PREFILLED_SYRINGE | INTRAVENOUS | Status: AC
  Filled 2021-01-21: qty 10

## 2021-01-21 MED ORDER — SODIUM CHLORIDE 0.9 % IV SOLN: 250.0000 mL | INTRAVENOUS | Status: DC

## 2021-01-21 MED ORDER — PROMETHAZINE HCL 25 MG PO TABS: 25.0000 mg | ORAL_TABLET | Freq: Four times a day (QID) | ORAL | Status: DC | PRN

## 2021-01-21 MED ORDER — ONDANSETRON HCL 4 MG/2ML IJ SOLN
INTRAMUSCULAR | Status: DC | PRN
  Administered 2021-01-21: 4 mg via INTRAVENOUS

## 2021-01-21 MED ORDER — ACETAMINOPHEN 10 MG/ML IV SOLN
INTRAVENOUS | Status: DC | PRN
  Administered 2021-01-21: 1000 mg via INTRAVENOUS

## 2021-01-21 MED ORDER — OXYCODONE HCL 5 MG PO TABS
ORAL_TABLET | ORAL | Status: AC
  Filled 2021-01-21: qty 2

## 2021-01-21 MED ORDER — CYCLOBENZAPRINE HCL 10 MG PO TABS
ORAL_TABLET | ORAL | Status: AC
  Filled 2021-01-21: qty 1

## 2021-01-21 MED ORDER — HYDROMORPHONE HCL 1 MG/ML IJ SOLN: 0.5000 mg | INTRAMUSCULAR | Status: DC | PRN

## 2021-01-21 MED ORDER — SUCCINYLCHOLINE CHLORIDE 200 MG/10ML IV SOSY
PREFILLED_SYRINGE | INTRAVENOUS | Status: AC
  Filled 2021-01-21: qty 10

## 2021-01-21 MED ORDER — DEXAMETHASONE SODIUM PHOSPHATE 10 MG/ML IJ SOLN
INTRAMUSCULAR | Status: AC
  Filled 2021-01-21: qty 1

## 2021-01-21 MED ORDER — CEFAZOLIN SODIUM-DEXTROSE 2-4 GM/100ML-% IV SOLN
INTRAVENOUS | Status: AC
  Filled 2021-01-21: qty 100

## 2021-01-21 MED ORDER — LACTATED RINGERS IV SOLN: INTRAVENOUS | Status: DC

## 2021-01-21 MED ORDER — MIDAZOLAM HCL 2 MG/2ML IJ SOLN
INTRAMUSCULAR | Status: DC | PRN
  Administered 2021-01-21: 2 mg via INTRAVENOUS

## 2021-01-21 SURGICAL SUPPLY — 53 items
BAND RUBBER #18 3X1/16 STRL (MISCELLANEOUS) ×6 IMPLANT
BENZOIN TINCTURE PRP APPL 2/3 (GAUZE/BANDAGES/DRESSINGS) ×3 IMPLANT
BLADE CLIPPER SURG (BLADE) IMPLANT
BLADE SURG 11 STRL SS (BLADE) ×3 IMPLANT
BUR MATCHSTICK NEURO 3.0 LAGG (BURR) ×3 IMPLANT
BUR PRECISION FLUTE 6.0 (BURR) ×3 IMPLANT
CANISTER SUCT 3000ML PPV (MISCELLANEOUS) ×3 IMPLANT
CARTRIDGE OIL MAESTRO DRILL (MISCELLANEOUS) ×2 IMPLANT
COVER WAND RF STERILE (DRAPES) ×3 IMPLANT
DECANTER SPIKE VIAL GLASS SM (MISCELLANEOUS) ×3 IMPLANT
DERMABOND ADVANCED (GAUZE/BANDAGES/DRESSINGS) ×1
DERMABOND ADVANCED .7 DNX12 (GAUZE/BANDAGES/DRESSINGS) ×2 IMPLANT
DIFFUSER DRILL AIR PNEUMATIC (MISCELLANEOUS) ×3 IMPLANT
DRAPE HALF SHEET 40X57 (DRAPES) IMPLANT
DRAPE LAPAROTOMY 100X72X124 (DRAPES) ×3 IMPLANT
DRAPE MICROSCOPE LEICA (MISCELLANEOUS) ×3 IMPLANT
DRAPE POUCH INSTRU U-SHP 10X18 (DRAPES) ×3 IMPLANT
DRAPE SURG 17X23 STRL (DRAPES) ×3 IMPLANT
DRSG OPSITE POSTOP 4X6 (GAUZE/BANDAGES/DRESSINGS) ×3 IMPLANT
DURAPREP 26ML APPLICATOR (WOUND CARE) ×3 IMPLANT
ELECT BLADE 4.0 EZ CLEAN MEGAD (MISCELLANEOUS) ×3
ELECT REM PT RETURN 9FT ADLT (ELECTROSURGICAL) ×3
ELECTRODE BLDE 4.0 EZ CLN MEGD (MISCELLANEOUS) ×2 IMPLANT
ELECTRODE REM PT RTRN 9FT ADLT (ELECTROSURGICAL) ×2 IMPLANT
GAUZE 4X4 16PLY RFD (DISPOSABLE) IMPLANT
GAUZE SPONGE 4X4 12PLY STRL (GAUZE/BANDAGES/DRESSINGS) ×3 IMPLANT
GLOVE BIO SURGEON STRL SZ7 (GLOVE) IMPLANT
GLOVE BIO SURGEON STRL SZ8 (GLOVE) ×3 IMPLANT
GLOVE ECLIPSE 7.5 STRL STRAW (GLOVE) IMPLANT
GLOVE EXAM NITRILE XL STR (GLOVE) IMPLANT
GLOVE INDICATOR 8.5 STRL (GLOVE) ×3 IMPLANT
GLOVE SURG UNDER POLY LF SZ7 (GLOVE) IMPLANT
GOWN STRL REUS W/ TWL LRG LVL3 (GOWN DISPOSABLE) ×2 IMPLANT
GOWN STRL REUS W/ TWL XL LVL3 (GOWN DISPOSABLE) ×4 IMPLANT
GOWN STRL REUS W/TWL 2XL LVL3 (GOWN DISPOSABLE) IMPLANT
GOWN STRL REUS W/TWL LRG LVL3 (GOWN DISPOSABLE) ×1
GOWN STRL REUS W/TWL XL LVL3 (GOWN DISPOSABLE) ×2
KIT BASIN OR (CUSTOM PROCEDURE TRAY) ×3 IMPLANT
KIT TURNOVER KIT B (KITS) ×3 IMPLANT
NEEDLE HYPO 22GX1.5 SAFETY (NEEDLE) ×3 IMPLANT
NEEDLE SPNL 22GX3.5 QUINCKE BK (NEEDLE) ×3 IMPLANT
NS IRRIG 1000ML POUR BTL (IV SOLUTION) ×3 IMPLANT
OIL CARTRIDGE MAESTRO DRILL (MISCELLANEOUS) ×3
PACK LAMINECTOMY NEURO (CUSTOM PROCEDURE TRAY) ×3 IMPLANT
SPONGE SURGIFOAM ABS GEL SZ50 (HEMOSTASIS) ×3 IMPLANT
STRIP CLOSURE SKIN 1/2X4 (GAUZE/BANDAGES/DRESSINGS) ×3 IMPLANT
SUT VIC AB 0 CT1 18XCR BRD8 (SUTURE) ×2 IMPLANT
SUT VIC AB 0 CT1 8-18 (SUTURE) ×1
SUT VIC AB 2-0 CT1 18 (SUTURE) ×3 IMPLANT
SUT VICRYL 4-0 PS2 18IN ABS (SUTURE) ×3 IMPLANT
TOWEL GREEN STERILE (TOWEL DISPOSABLE) ×3 IMPLANT
TOWEL GREEN STERILE FF (TOWEL DISPOSABLE) ×3 IMPLANT
WATER STERILE IRR 1000ML POUR (IV SOLUTION) ×3 IMPLANT

## 2021-01-21 NOTE — Anesthesia Preprocedure Evaluation (Addendum)
Anesthesia Evaluation  Patient identified by MRN, date of birth, ID band Patient awake    Reviewed: Allergy & Precautions, H&P , NPO status , Patient's Chart, lab work & pertinent test results  Airway Mallampati: III  TM Distance: >3 FB Neck ROM: Full    Dental no notable dental hx. (+) Teeth Intact, Dental Advisory Given   Pulmonary Current Smoker and Patient abstained from smoking.,    Pulmonary exam normal breath sounds clear to auscultation       Cardiovascular negative cardio ROS   Rhythm:Regular Rate:Normal     Neuro/Psych negative neurological ROS  negative psych ROS   GI/Hepatic Neg liver ROS, PUD,   Endo/Other  Morbid obesity  Renal/GU negative Renal ROS  negative genitourinary   Musculoskeletal   Abdominal   Peds  Hematology negative hematology ROS (+)   Anesthesia Other Findings   Reproductive/Obstetrics negative OB ROS                             Anesthesia Physical Anesthesia Plan  ASA: II  Anesthesia Plan: General   Post-op Pain Management:    Induction: Intravenous  PONV Risk Score and Plan: 3 and Ondansetron, Dexamethasone and Midazolam  Airway Management Planned: Oral ETT  Additional Equipment:   Intra-op Plan:   Post-operative Plan: Extubation in OR  Informed Consent: I have reviewed the patients History and Physical, chart, labs and discussed the procedure including the risks, benefits and alternatives for the proposed anesthesia with the patient or authorized representative who has indicated his/her understanding and acceptance.     Dental advisory given  Plan Discussed with: CRNA  Anesthesia Plan Comments:         Anesthesia Quick Evaluation

## 2021-01-21 NOTE — Op Note (Signed)
Preoperative diagnosis: Right S1 radiculopathy from herniated nucleus pulposus L5-S1 right  Postoperative diagnosis: Same  Procedure: Lumbar laminectomy and microdiscectomy L5-S1 on the right with microdissection of the right S1 nerve root microscopic discectomy  Surgeon: Dominica Severin Toye Rouillard  Anesthesia: General  EBL: Minimal  HPI: 30 year old female progressive worsening back and right S1 radiculopathy work-up revealed a very large disc radiation with a free fragment causing severe thecal sac compression and right S1 nerve root compression.  Due to failed conservative treatment imaging findings revealing severe thecal sac compression patient with urinary retention and declining neurologic exam I recommended microdiscectomy L5-S1 the right.  I extensively reviewed the risks and benefits of the operation with her as well as perioperative course expectations of outcome and alternatives of surgery and she understood and agreed to proceed forward.  Operative procedure: Patient was brought into the OR was Duson general anesthesia positioned prone Wilson frame her back was prepped and draped in routine sterile fashion preoperative x-ray localized the appropriate level.  Then after infiltration of lidocaine and Marcaine mix midline incision was made and Bovie letter cautery was used take down subcutaneous tissue and subperiosteal dissection was carried lamina of L5 and S1 on the right.  Intraoperative x-ray confirmed identification appropriate level.  High-speed drill was used to drill down the inferior aspect level L5 medial facet complex superior aspect of S1 laminotomy was begun with a 2 and 3 uh Kerrison punch.  Ligament flow was identified and removed in piecemeal fashion.  Then under by the medial facet down identification of the right S1 nerve root.  Then under microscopic lamination further under biting medial facet identified the S1 pedicle and there was a disc herniation presenting in the axilla however it  was densely adherent so I dissected the S1 nerve root off of the disc herniation reflected it medially incised the annulus and removed several large free fragments of disc.  Cleaned out the disc base with pituitary rongeurs and Epstein curettes and at the end of discectomy there is no further stenosis on thecal sac or right S1 nerve root.  I was able to pass a coronary dilator cephalad caudal medially and laterally and out the foramen without resistance.  The wound was then copiously irrigated meticulous hemostasis was maintained Gelfoam was laid top of the dura and the muscle fascia approximate layers with Vicryl skin was closed running 4 subcuticular Dermabond benzoin Steri-Strips and a sterile dressing was applied and patient went to recovery room in stable condition.  At the end the case all needle counts and sponge counts were correct.

## 2021-01-21 NOTE — ED Provider Notes (Signed)
[facility] EMERGENCY DEPARTMENT Provider Note   CSN: [id number] Arrival date & time: 01/21/21  0160     History Chief Complaint  Patient presents with  . Back Pain    Kimberly Bautista is a 30 y.o. female history of ulcerative colitis and chronic back pain arrives to the ER today for evaluation of back pain.  Patient reports severe lower back pain onset yesterday evening constant nonradiating worsened with movement no alleviating factors.  She reports that she has been unable to urinate since yesterday evening she attempted to this morning but cannot start urinating.  She was headed to her orthopedic office today to see Dr. Junius Roads who was going to refer her to a back surgeon but due to her pain and urinary retention patient reports she was sent to the ER for further evaluation.  Patient reports she has had chronic back pain and regularly receives injections, last injection was around 1 month ago.  Patient denies fever/chills, chest pain, abdominal pain, nausea/vomiting, dysuria/hematuria, extremity numbness/weakness, saddle or paresthesias or any additional concerns  HPI     Past Medical History:  Diagnosis Date  . Ulcerative colitis ([place])     There are no problems to display for this patient.   Past Surgical History:  Procedure Laterality Date  . TONSILLECTOMY       OB History   No obstetric history on file.     No family history on file.  Social History   Tobacco Use  . Smoking status: Current Every Day Smoker  . Smokeless tobacco: Never Used  Substance Use Topics  . Alcohol use: Yes  . Drug use: Never    Home Medications Prior to Admission medications   Medication Sig Start Date End Date Taking? Authorizing Provider  cetirizine (ZYRTEC) 10 MG tablet Take 10 mg by mouth daily. 01/10/20   [provider]  famotidine (PEPCID) 20 MG tablet Take 1 tablet (20 mg total) by mouth 2 (two) times daily. 08/18/20   Fredia Sorrow, MD   ondansetron (ZOFRAN ODT) 4 MG disintegrating tablet Take 1 tablet (4 mg total) by mouth every 8 (eight) hours as needed. 08/18/20   Fredia Sorrow, MD  promethazine (PHENERGAN) 25 MG tablet Take 1 tablet (25 mg total) by mouth every 6 (six) hours as needed for nausea or vomiting. 08/17/20   Noemi Chapel, MD  traMADol (ULTRAM) 50 MG tablet Take 1 tablet (50 mg total) by mouth every 6 (six) hours as needed. 01/20/21   Hilts, Legrand Como, MD    Allergies    Penicillins  Review of Systems   Review of Systems Ten systems are reviewed and are negative for acute change except as noted in the HPI  Physical Exam Updated Vital Signs BP 113/76 (BP Location: Right Arm)   Pulse (!) 56   Temp 98.4 F (36.9 C) (Oral)   Resp 17   Ht 5' 5"  (1.651 m)   Wt 102.1 kg   SpO2 93%   BMI 37.46 kg/m   Physical Exam Constitutional:      General: She is not in acute distress.    Appearance: Normal appearance. She is well-developed. She is not ill-appearing or diaphoretic.  HENT:     Head: Normocephalic and atraumatic.  Eyes:     General: Vision grossly intact. Gaze aligned appropriately.     Pupils: Pupils are equal, round, and reactive to light.  Neck:     Trachea: Trachea and phonation normal.  Cardiovascular:     Rate  and Rhythm: Normal rate and regular rhythm.     Pulses:          Dorsalis pedis pulses are 2+ on the right side and 2+ on the left side.  Pulmonary:     Effort: Pulmonary effort is normal. No respiratory distress.  Abdominal:     General: There is no distension.     Palpations: Abdomen is soft.     Tenderness: There is no abdominal tenderness. There is no guarding or rebound.  Musculoskeletal:        General: Normal range of motion.     Cervical back: Normal range of motion.  Feet:     Right foot:     Protective Sensation: 3 sites tested. 3 sites sensed.     Left foot:     Protective Sensation: 3 sites tested. 3 sites sensed.  Skin:    General: Skin is warm and dry.   Neurological:     Mental Status: She is alert.     GCS: GCS eye subscore is 4. GCS verbal subscore is 5. GCS motor subscore is 6.     Comments: Speech is clear and goal oriented, follows commands Major Cranial nerves without deficit, no facial droop Moves extremities without ataxia, coordination intact  Psychiatric:        Behavior: Behavior normal.     ED Results / Procedures / Treatments   Labs (all labs ordered are listed, but only abnormal results are displayed) Labs Reviewed  CBC WITH DIFFERENTIAL/PLATELET - Abnormal; Notable for the following components:      Result Value   HCT 46.3 (*)    All other components within normal limits  RESP PANEL BY RT-PCR (FLU A&B, COVID) ARPGX2  URINALYSIS, ROUTINE W REFLEX MICROSCOPIC  BASIC METABOLIC PANEL  I-STAT BETA HCG BLOOD, ED ([facility], [facility], [facility] ONLY)    EKG None  Radiology MR THORACIC SPINE WO CONTRAST  Result Date: 01/21/2021 CLINICAL DATA:  Back pain.  Suspect cauda equina syndrome. EXAM: MRI THORACIC SPINE WITHOUT CONTRAST TECHNIQUE: Multiplanar, multisequence MR imaging of the thoracic spine was performed. No intravenous contrast was administered. COMPARISON:  None. FINDINGS: Alignment:  Normal Vertebrae: Normal bone marrow.  Negative for fracture or mass. Cord:  Normal signal and morphology. Paraspinal and other soft tissues: Negative for paraspinous mass or edema. Disc levels: Abnormal disc spaces described below. T2-3: Small right-sided disc and osteophyte without stenosis. T4-5: Small bilateral disc protrusions without significant stenosis T5-6: Small right-sided disc protrusion without significant stenosis. T6-7: Small right-sided disc protrusion T7-8: Small right-sided disc protrusion with mild cord flattening on the right T9-10: Small left foraminal disc protrusion without significant stenosis. IMPRESSION: Mild thoracic degenerative change. Multiple small disc protrusions in the thoracic spine without significant neural impingement.  Negative for cord compression. Electronically Signed   By: Franchot Gallo M.D.   On: 01/21/2021 13:38   MR LUMBAR SPINE WO CONTRAST  Result Date: 01/21/2021 CLINICAL DATA:  Low back pain.  Rule out cauda equina syndrome. EXAM: MRI LUMBAR SPINE WITHOUT CONTRAST TECHNIQUE: Multiplanar, multisequence MR imaging of the lumbar spine was performed. No intravenous contrast was administered. COMPARISON:  MRI lumbar spine 07/26/2020 FINDINGS: Segmentation:  Normal Alignment:  Normal Vertebrae:  Normal bone marrow. Negative for fracture or mass. Conus medullaris and cauda equina: Conus extends to the L1-2 level. Conus and cauda equina appear normal. Paraspinal and other soft tissues: Negative for paraspinous mass or adenopathy. Disc levels: L1-2: Negative L2-3: Negative L3-4: Negative L4-5: Small central disc protrusion  unchanged from the prior study. Mild facet degeneration. Negative for neural impingement. L5-S1: Large extruded disc fragment on the right with significant progression from the prior MRI. There is compression of thecal sac and compression of the right S1 nerve root. IMPRESSION: Small central disc protrusion L4-5 unchanged without neural impingement Large extruded disc fragment on the right L5-S1 has progressed significantly since the prior study. 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 Electronically Signed   By: Franchot Gallo M.D.   On: 01/21/2021 13:41    Procedures Procedures   Medications Ordered in ED Medications  morphine 4 MG/ML injection 4 mg (4 mg Intravenous Given 01/21/21 1002)  morphine 4 MG/ML injection 4 mg (4 mg Intravenous Given 01/21/21 1215)  dexamethasone (DECADRON) injection 10 mg (10 mg Intravenous Given 01/21/21 1441)    ED Course  I have reviewed the triage vital signs and the nursing notes.  Pertinent labs & imaging results that were available during my care of the patient were reviewed by me and considered in my medical decision making  (see chart for details).  Clinical Course as of 01/21/21 1503  Tue Jan 21, 2021  [id number] Dr Hilts [BM]  1406 Dr. Saintclair Halsted [BM]    Clinical Course User Index [BM] Gari Crown   MDM Rules/Calculators/A&P                         Additional history obtained from: 1. Nursing notes from this visit. 2. Review of electronic medical records. - 30 year old female history as above presented for severe lower back pain similar to her chronic low back pain but now with urinary retention that began yesterday.  Sent in by orthopedist for further evaluation.  9:45 AM: Consulted with patient's orthopedic specialist Dr. Junius Roads, agrees with MRI of the lumbar and thoracic spine, no other recommendations at this time. - I ordered, reviewed and interpreted labs which include: CBC shows no leukocytosis, anemia or thrombocytopenia. BMP shows no emergent electrolyte derangement AKI or gap. Urinalysis shows no evidence of infection. Beta-hCG is negative.  MRI Thoracic Spine:  IMPRESSION:  Mild thoracic degenerative change. Multiple small disc protrusions  in the thoracic spine without significant neural impingement.  Negative for cord compression.   MRI Lumbar Spine:  IMPRESSION:  Small central disc protrusion L4-5 unchanged without neural  impingement    Large extruded disc fragment on the right L5-S1 has progressed  significantly since the prior study. This is causing spinal stenosis  and right S1 nerve root compression.   2:06 PM: Consult with neurosurgeon Dr. Saintclair Halsted, advises to give patient 10 mg IV Decadron, obtain postvoid residual.  Dr. Windy Carina team will be by to evaluate patient.  Asked to hold off on medicine consultation until they assess the patient - Care handoff given to Carlisle Cater PA-C at shift change, plan of care is to await neurosurgery recommendations.  Note: Portions of this report may have been transcribed using voice recognition software. Every effort was made to ensure  accuracy; however, inadvertent computerized transcription errors may still be present. Final Clinical Impression(s) / ED Diagnoses Final diagnoses:  Low back pain, unspecified back pain laterality, unspecified chronicity, unspecified whether sciatica present    Rx / DC Orders ED Discharge Orders         Ordered    Measure post void residual        01/21/21 1414           [place],  Camillia Herter, PA-C 01/21/21 1503    Lajean Saver, MD 01/23/21 1558

## 2021-01-21 NOTE — ED Triage Notes (Signed)
Pt presents to the ED with back pain. Pt currently getting injections in her back for a bulging/slipped disc. Pain has increased drastically in the last day. Pt has equal sensation in lower etremities. Denies loss of bowel and bladder.

## 2021-01-21 NOTE — H&P (Signed)
Kimberly Bautista is an 30 y.o. female.   Chief Complaint: back and right leg pain and urinary retention HPI: 30 year old female who said longstanding back and right leg pain for the last several months.  Patient's been getting treated with intermittent injections with intermittent success.  Her pain got acutely worse on Saturday and then on Monday began having difficulty voiding.  Presented to ER this morning has been unable to void underwent 2 catheterizations of volumes that were under 500.  She denies any saddle anesthesia.  Work-up in the ER showed a very large and progressive disclamation with severe right S1 nerve root compression of thecal sac compression.  Patient does have urinary retention I do not think she has a florid cauda equina syndrome but due to the size and location of the fragment urinary retention and failure of conservative treatment over the last several months have recommended emergent lumbar microdiscectomy I have extensively gone over the risks and benefits of a right-sided microdiscectomy at L5-S1 as well as perioperative course expectations of outcome and alternatives of surgery and she understands and agrees to proceed forward.  Past Medical History:  Diagnosis Date  . Ulcerative colitis [facility])     Past Surgical History:  Procedure Laterality Date  . TONSILLECTOMY      No family history on file. Social History:  reports that she has been smoking. She has never used smokeless tobacco. She reports current alcohol use. She reports that she does not use drugs.  Allergies:  Allergies  Allergen Reactions  . Penicillins     (Not in a hospital admission)   Results for orders placed or performed during the hospital encounter of 01/21/21 (from the past 48 hour(s))  Urinalysis, Routine w reflex microscopic Urine, Catheterized     Status: None   Collection Time: 01/21/21  9:47 AM  Result Value Ref Range   Color, Urine YELLOW YELLOW   APPearance CLEAR CLEAR   Specific  Gravity, Urine 1.027 1.005 - 1.030   pH 5.0 5.0 - 8.0   Glucose, UA NEGATIVE NEGATIVE mg/dL   Hgb urine dipstick NEGATIVE NEGATIVE   Bilirubin Urine NEGATIVE NEGATIVE   Ketones, ur NEGATIVE NEGATIVE mg/dL   Protein, ur NEGATIVE NEGATIVE mg/dL   Nitrite NEGATIVE NEGATIVE   Leukocytes,Ua NEGATIVE NEGATIVE    Comment: Performed at [facility] [address], [place], [place] [address]  CBC with Differential     Status: Abnormal   Collection Time: 01/21/21  9:47 AM  Result Value Ref Range   WBC 9.6 4.0 - 10.5 K/uL   RBC 4.97 3.87 - 5.11 MIL/uL   Hemoglobin 14.9 12.0 - 15.0 g/dL   HCT 46.3 (H) 36.0 - 46.0 %   MCV 93.2 80.0 - 100.0 fL   MCH 30.0 26.0 - 34.0 pg   MCHC 32.2 30.0 - 36.0 g/dL   RDW 12.8 11.5 - 15.5 %   Platelets 285 150 - 400 K/uL   nRBC 0.0 0.0 - 0.2 %   Neutrophils Relative % 63 %   Neutro Abs 6.0 1.7 - 7.7 K/uL   Lymphocytes Relative 28 %   Lymphs Abs 2.7 0.7 - 4.0 K/uL   Monocytes Relative 5 %   Monocytes Absolute 0.5 0.1 - 1.0 K/uL   Eosinophils Relative 2 %   Eosinophils Absolute 0.2 0.0 - 0.5 K/uL   Basophils Relative 1 %   Basophils Absolute 0.1 0.0 - 0.1 K/uL   Immature Granulocytes 1 %   Abs Immature Granulocytes 0.06  0.00 - 0.07 K/uL    Comment: Performed at [facility], [place] [address], [place], [place] [address]  Basic metabolic panel     Status: None   Collection Time: 01/21/21  9:47 AM  Result Value Ref Range   Sodium 139 135 - 145 mmol/L   Potassium 3.5 3.5 - 5.1 mmol/L   Chloride 106 98 - 111 mmol/L   CO2 24 22 - 32 mmol/L   Glucose, Bld 90 70 - 99 mg/dL    Comment: Glucose reference range applies only to samples taken after fasting for at least 8 hours.   BUN 11 6 - 20 mg/dL   Creatinine, Ser 0.88 0.44 - 1.00 mg/dL   Calcium 9.0 8.9 - 10.3 mg/dL   GFR, Estimated >60 >60 mL/min    Comment: (NOTE) Calculated using the CKD-EPI Creatinine Equation (2021)    Anion gap 9 5 - 15    Comment: Performed at [facility] [address], [place],  [address]  I-Stat Beta hCG blood, ED ([facility], [facility], [facility] only)     Status: None   Collection Time: 01/21/21  9:59 AM  Result Value Ref Range   I-stat hCG, quantitative <5.0 <5 mIU/mL   Comment 3            Comment:   GEST. AGE      CONC.  (mIU/mL)   <=1 WEEK        5 - 50     2 WEEKS       50 - 500     3 WEEKS       100 - 10,000     4 WEEKS     1,000 - 30,000        FEMALE AND NON-PREGNANT FEMALE:     LESS THAN 5 mIU/mL   Resp Panel by RT-PCR (Flu A&B, Covid) Nasopharyngeal Swab     Status: None   Collection Time: 01/21/21  1:49 PM   Specimen: Nasopharyngeal Swab; Nasopharyngeal(NP) swabs in vial transport medium  Result Value Ref Range   SARS Coronavirus 2 by RT PCR NEGATIVE NEGATIVE    Comment: (NOTE) SARS-CoV-2 target nucleic acids are NOT DETECTED.  The SARS-CoV-2 RNA is generally detectable in upper respiratory specimens during the acute phase of infection. The lowest concentration of SARS-CoV-2 viral copies this assay can detect is 138 copies/mL. A negative result does not preclude SARS-Cov-2 infection and should not be used as the sole basis for treatment or other patient management decisions. A negative result may occur with  improper specimen collection/handling, submission of specimen other than nasopharyngeal swab, presence of viral mutation(s) within the areas targeted by this assay, and inadequate number of viral copies(<138 copies/mL). A negative result must be combined with clinical observations, patient history, and epidemiological information. The expected result is Negative.  Fact Sheet for Patients:  EntrepreneurPulse.com.au  Fact Sheet for Healthcare Providers:  IncredibleEmployment.be  This test is no t yet approved or cleared by the [place] FDA and  has been authorized for detection and/or diagnosis of SARS-CoV-2 by FDA under an Emergency Use Authorization (EUA). This EUA will remain  in effect  (meaning this test can be used) for the duration of the COVID-19 declaration under Section 564(b)(1) of the Act, 21 U.S.C.section 360bbb-3(b)(1), unless the authorization is terminated  or revoked sooner.       Influenza A by PCR NEGATIVE NEGATIVE   Influenza B by PCR NEGATIVE NEGATIVE    Comment: (  NOTE) The Xpert Xpress SARS-CoV-2/FLU/RSV plus assay is intended as an aid in the diagnosis of influenza from Nasopharyngeal swab specimens and should not be used as a sole basis for treatment. Nasal washings and aspirates are unacceptable for Xpert Xpress SARS-CoV-2/FLU/RSV testing.  Fact Sheet for Patients: EntrepreneurPulse.com.au  Fact Sheet for Healthcare Providers: IncredibleEmployment.be  This test is not yet approved or cleared by the [place] FDA and has been authorized for detection and/or diagnosis of SARS-CoV-2 by FDA under an Emergency Use Authorization (EUA). This EUA will remain in effect (meaning this test can be used) for the duration of the COVID-19 declaration under Section 564(b)(1) of the Act, 21 U.S.C. section 360bbb-3(b)(1), unless the authorization is terminated or revoked.  Performed at [facility], [place] [address], [place], [place] [address]    MR THORACIC SPINE WO CONTRAST  Result Date: 01/21/2021 CLINICAL DATA:  Back pain.  Suspect cauda equina syndrome. EXAM: MRI THORACIC SPINE WITHOUT CONTRAST TECHNIQUE: Multiplanar, multisequence MR imaging of the thoracic spine was performed. No intravenous contrast was administered. COMPARISON:  None. FINDINGS: Alignment:  Normal Vertebrae: Normal bone marrow.  Negative for fracture or mass. Cord:  Normal signal and morphology. Paraspinal and other soft tissues: Negative for paraspinous mass or edema. Disc levels: Abnormal disc spaces described below. T2-3: Small right-sided disc and osteophyte without stenosis. T4-5: Small bilateral disc protrusions without significant  stenosis T5-6: Small right-sided disc protrusion without significant stenosis. T6-7: Small right-sided disc protrusion T7-8: Small right-sided disc protrusion with mild cord flattening on the right T9-10: Small left foraminal disc protrusion without significant stenosis. IMPRESSION: Mild thoracic degenerative change. Multiple small disc protrusions in the thoracic spine without significant neural impingement. Negative for cord compression. Electronically Signed   By: Franchot Gallo M.D.   On: 01/21/2021 13:38   MR LUMBAR SPINE WO CONTRAST  Result Date: 01/21/2021 CLINICAL DATA:  Low back pain.  Rule out cauda equina syndrome. EXAM: MRI LUMBAR SPINE WITHOUT CONTRAST TECHNIQUE: Multiplanar, multisequence MR imaging of the lumbar spine was performed. No intravenous contrast was administered. COMPARISON:  MRI lumbar spine 07/26/2020 FINDINGS: Segmentation:  Normal Alignment:  Normal Vertebrae:  Normal bone marrow. Negative for fracture or mass. Conus medullaris and cauda equina: Conus extends to the L1-2 level. Conus and cauda equina appear normal. Paraspinal and other soft tissues: Negative for paraspinous mass or adenopathy. Disc levels: L1-2: Negative L2-3: Negative L3-4: Negative L4-5: Small central disc protrusion unchanged from the prior study. Mild facet degeneration. Negative for neural impingement. L5-S1: Large extruded disc fragment on the right with significant progression from the prior MRI. There is compression of thecal sac and compression of the right S1 nerve root. IMPRESSION: Small central disc protrusion L4-5 unchanged without neural impingement Large extruded disc fragment on the right L5-S1 has progressed significantly since the prior study. 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 Electronically Signed   By: Franchot Gallo M.D.   On: 01/21/2021 13:41    Review of Systems  Genitourinary: Positive for difficulty urinating.  Musculoskeletal: Positive for back pain.   Neurological: Positive for numbness.    Blood pressure (!) 133/93, pulse (!) 58, temperature 98.4 F (36.9 C), temperature source Oral, resp. rate 18, height 5' 5"  (1.651 m), weight 102.1 kg, SpO2 97 %. Physical Exam HENT:     Head: Normocephalic.     Right Ear: Tympanic membrane normal.     Nose: Nose normal.     Mouth/Throat:     Mouth:  Mucous membranes are moist.  Cardiovascular:     Rate and Rhythm: Normal rate.  Pulmonary:     Effort: Pulmonary effort is normal.  Abdominal:     General: Abdomen is flat.  Musculoskeletal:        General: Normal range of motion.  Skin:    General: Skin is warm.     Capillary Refill: Capillary refill takes less than 2 seconds.  Neurological:     General: No focal deficit present.     Mental Status: She is alert.     Comments: 5-5 strength iliopsoas, quads, hamstrings, gastroc, tibialis, and EHL.      Assessment/Plan 30 year old female presents for right-sided L5-S1 lumbar microdiscectomy  Elaina Hoops, MD 01/21/2021, 6:17 PM

## 2021-01-21 NOTE — Transfer of Care (Signed)
Immediate Anesthesia Transfer of Care Note  Patient: Kimberly Bautista  Procedure(s) Performed: RIGHT LUMBAR FIVE-SACRAL ONE LUMBAR LAMINECTOMY/DECOMPRESSION MICRODISCECTOMY (Right Spine Lumbar)  Patient Location: PACU  Anesthesia Type:General  Level of Consciousness: awake, oriented and sedated  Airway & Oxygen Therapy: Patient Spontanous Breathing  Post-op Assessment: Report given to RN and Post -op Vital signs reviewed and stable  Post vital signs: Reviewed and stable  Last Vitals:  Vitals Value Taken Time  BP 144/99 01/21/21 2140  Temp 36.9 C 01/21/21 2140  Pulse 78 01/21/21 2147  Resp 12 01/21/21 2147  SpO2 96 % 01/21/21 2147  Vitals shown include unvalidated device data.  Last Pain:  Vitals:   01/21/21 1523  TempSrc:   PainSc: 7          Complications: No complications documented.

## 2021-01-21 NOTE — Anesthesia Postprocedure Evaluation (Signed)
Anesthesia Post Note  Patient: Embrie Mikkelsen  Procedure(s) Performed: RIGHT LUMBAR FIVE-SACRAL ONE LUMBAR LAMINECTOMY/DECOMPRESSION MICRODISCECTOMY (Right Spine Lumbar)     Patient location during evaluation: PACU Anesthesia Type: General Level of consciousness: awake and alert Pain management: pain level controlled Vital Signs Assessment: post-procedure vital signs reviewed and stable Respiratory status: spontaneous breathing, nonlabored ventilation and respiratory function stable Cardiovascular status: blood pressure returned to baseline and stable Postop Assessment: no apparent nausea or vomiting Anesthetic complications: no   No complications documented.  Last Vitals:  Vitals:   01/21/21 2155 01/21/21 2210  BP: (!) 130/92 129/76  Pulse: 67 89  Resp: 10 16  Temp:    SpO2: 96% 93%    Last Pain:  Vitals:   01/21/21 2200  TempSrc:   PainSc: 6                  Justiss Gerbino,W. EDMOND

## 2021-01-21 NOTE — Anesthesia Procedure Notes (Signed)
Procedure Name: Intubation Date/Time: 01/21/2021 7:58 PM Performed by: Valetta Fuller, CRNA Pre-anesthesia Checklist: Patient identified, Emergency Drugs available, Suction available and Patient being monitored Patient Re-evaluated:Patient Re-evaluated prior to induction Oxygen Delivery Method: Circle system utilized Preoxygenation: Pre-oxygenation with 100% oxygen Induction Type: IV induction Ventilation: Mask ventilation without difficulty Laryngoscope Size: Miller and 2 Grade View: Grade I Tube type: Oral Tube size: 7.5 mm Number of attempts: 1 Airway Equipment and Method: Stylet Placement Confirmation: ETT inserted through vocal cords under direct vision,  positive ETCO2 and breath sounds checked- equal and bilateral Secured at: 23 cm Tube secured with: Tape Dental Injury: Teeth and Oropharynx as per pre-operative assessment

## 2021-01-21 NOTE — ED Notes (Signed)
Patient transported to MRI 

## 2021-01-22 ENCOUNTER — Encounter (HOSPITAL_COMMUNITY): Payer: Self-pay | Admitting: Neurosurgery

## 2021-01-22 MED ORDER — METHOCARBAMOL 500 MG PO TABS: 500.0000 mg | ORAL_TABLET | Freq: Four times a day (QID) | ORAL | 0 refills | Status: DC

## 2021-01-22 MED ORDER — HYDROCODONE-ACETAMINOPHEN 5-325 MG PO TABS: 1.0000 | ORAL_TABLET | ORAL | 0 refills | Status: AC | PRN

## 2021-01-22 MED ORDER — VANCOMYCIN HCL 1250 MG/250ML IV SOLN
1250.0000 mg | Freq: Once | INTRAVENOUS | Status: AC
  Administered 2021-01-22: 1250 mg via INTRAVENOUS
  Filled 2021-01-22: qty 250

## 2021-01-22 NOTE — Evaluation (Signed)
Physical Therapy Evaluation - One-time Patient Details Name: Kimberly Bautista MRN: [id number] DOB: Feb 04, 1991 Today's Date: 01/22/2021   History of Present Illness  30 yo female s/p Lumbar laminectomy and microdiscectomy L5-S1 on the right with microdissection of the right S1 nerve root microscopic discectomy on 4/26. PMH includes ulcerative colitis.  Clinical Impression   Pt presents with post-operative back and RLE pain, decreased knowledge and application of spinal precautions, increased time and effort to mobilize, and decreased activity tolerance vs baseline. Pt up and ambulating with pt's mother upon PT arrival to room, ambulated additional hallway distance with PT limited only by pain. Pt proficiently navigated steps, and understands back precautions as it relates to functional mobility. Pt with no further PT needs, will sign off, pt with no further questions and education completed.     Follow Up Recommendations Follow surgeon's recommendation for DC plan and follow-up therapies;Supervision for mobility/OOB    Equipment Recommendations  Other (comment) (tub bench)    Recommendations for Other Services OT consult (placed)     Precautions / Restrictions Precautions Precautions: Back Precaution Booklet Issued: Yes (comment) Precaution Comments: BLT, log roll in/out of bed Required Braces or Orthoses: Other Brace Other Brace: no brace needed Restrictions Weight Bearing Restrictions: No      Mobility  Bed Mobility Overal bed mobility: Needs Assistance Bed Mobility: Rolling;Sidelying to Sit;Sit to Sidelying Rolling: Supervision Sidelying to sit: Supervision     Sit to sidelying: Supervision General bed mobility comments: supervision for log roll in/out of bed, verbal cuing for technique and pt with use of bedrails to perform (pt with firmer mattress at home to aid in push up to sitting)    Transfers Overall transfer level: Needs assistance Equipment used: None Transfers:  Sit to/from Stand Sit to Stand: Supervision         General transfer comment: for safety, slow to rise and sit  Ambulation/Gait Ambulation/Gait assistance: Supervision Gait Distance (Feet): 40 Feet (has been up and walking with mother) Assistive device: None Gait Pattern/deviations: Step-through pattern;Decreased stride length;Decreased dorsiflexion - right Gait velocity: decr   General Gait Details: supervision for safety, pt gait affected by numbness to R foot and back pain but overall performs well with increased time  Stairs Stairs: Yes Stairs assistance: Supervision Stair Management: One rail Left;Step to pattern;Forwards Number of Stairs: 4 General stair comments: supervision for safety, verbal cuing for ascending with LLE leading and descending with RLE leading given L foot numbness  Wheelchair Mobility    Modified Rankin (Stroke Patients Only)       Balance Overall balance assessment: Needs assistance Sitting-balance support: No upper extremity supported;Feet supported Sitting balance-Leahy Scale: Good     Standing balance support: No upper extremity supported;During functional activity Standing balance-Leahy Scale: Good                               Pertinent Vitals/Pain Pain Assessment: 0-10 Pain Score: 5  Pain Location: back Pain Descriptors / Indicators: Discomfort;Sore;Grimacing Pain Intervention(s): Limited activity within patient's tolerance;Monitored during session;Repositioned    Home Living Family/patient expects to be discharged to:: Private residence Living Arrangements: Alone Available Help at Discharge: Family (mother lives next door, plans to stay with pt initially to assist as needed) Type of Home: House Home Access: Stairs to enter Entrance Stairs-Rails: Left Entrance Stairs-Number of Steps: 3 Home Layout: One level Home Equipment: None      Prior Function Level of Independence: Independent  Comments: Pt  working as a Audiological scientist, has had back pain on and off for one year but recently worse     Hand Dominance   Dominant Hand: Right    Extremity/Trunk Assessment   Upper Extremity Assessment Upper Extremity Assessment: Defer to OT evaluation    Lower Extremity Assessment Lower Extremity Assessment: Overall WFL for tasks assessed;RLE deficits/detail RLE Sensation: decreased light touch (numbness in foot since surgery, some posterior numbness down leg as well)    Cervical / Trunk Assessment Cervical / Trunk Assessment: Other exceptions Cervical / Trunk Exceptions: s/p lumbar surgery  Communication   Communication: No difficulties  Cognition Arousal/Alertness: Awake/alert Behavior During Therapy: WFL for tasks assessed/performed Overall Cognitive Status: Within Functional Limits for tasks assessed                                        General Comments      Exercises Other Exercises Other Exercises: home walking program: up and walking short distances 1x/hour to decrease stiffness and promote LE circulation   Assessment/Plan    PT Assessment Patent does not need any further PT services  PT Problem List         PT Treatment Interventions      PT Goals (Current goals can be found in the Care Plan section)  Acute Rehab PT Goals Patient Stated Goal: go home PT Goal Formulation: With patient Time For Goal Achievement: 01/22/21 Potential to Achieve Goals: Good    Frequency     Barriers to discharge        Co-evaluation               AM-PAC PT "6 Clicks" Mobility  Outcome Measure Help needed turning from your back to your side while in a flat bed without using bedrails?: None Help needed moving from lying on your back to sitting on the side of a flat bed without using bedrails?: None Help needed moving to and from a bed to a chair (including a wheelchair)?: None Help needed standing up from a chair using your arms (e.g., wheelchair or bedside  chair)?: None Help needed to walk in hospital room?: A Little Help needed climbing 3-5 steps with a railing? : A Little 6 Click Score: 22    End of Session   Activity Tolerance: Patient tolerated treatment well Patient left: in bed;with call bell/phone within reach;with family/visitor present Nurse Communication: Mobility status PT Visit Diagnosis: Other abnormalities of gait and mobility (R26.89);Difficulty in walking, not elsewhere classified (R26.2)    Time: 4656-8127 PT Time Calculation (min) (ACUTE ONLY): 15 min   Charges:   PT Evaluation $PT Eval Low Complexity: 1 Low          Seairra Otani S, PT DPT Acute Rehabilitation Services Pager [phone]  Office [phone]   [place] E Ruffin Pyo 01/22/2021, 9:18 AM

## 2021-01-22 NOTE — Discharge Summary (Addendum)
  Physician Discharge Summary  Patient ID: Kimberly Bautista MRN: [id number] DOB/AGE: 1990-11-11 30 y.o. Estimated body mass index is 37.46 kg/m as calculated from the following:   Height as of this encounter: 5' 5"  (1.651 m).   Weight as of this encounter: 102.1 kg.   Admit date: 01/21/2021 Discharge date: 01/22/2021  Admission Diagnoses: Right S1 radiculopathy from herniated nucleus pulposus L5-S1 right  Discharge Diagnoses: Same Active Problems:   HNP (herniated nucleus pulposus), lumbar   Discharged Condition: good  Hospital Course: Patient was admitted to the hospital underwent lumbar microdiscectomy did very well postoperative patient had complete resolution of preoperative leg pain was ambulating and voiding spontaneously and stable for discharge home with scheduled follow-up in 2 weeks.  Consults: Significant Diagnostic Studies: Treatments: Right L5-S1 microdiscectomy Discharge Exam: Blood pressure 120/71, pulse 86, temperature 98.4 F (36.9 C), temperature source Oral, resp. rate 17, height 5' 5"  (1.651 m), weight 102.1 kg, SpO2 95 %. Strength 5/5 wound clean dry and intact  Disposition: Home  Discharge Instructions    Measure post void residual   Complete by: As directed      Allergies as of 01/22/2021      Reactions   Penicillins       Medication List    TAKE these medications   cetirizine 10 MG tablet Commonly known as: ZYRTEC Take 10 mg by mouth daily.   famotidine 20 MG tablet Commonly known as: PEPCID Take 1 tablet (20 mg total) by mouth 2 (two) times daily.   HYDROcodone-acetaminophen 5-325 MG tablet Commonly known as: NORCO/VICODIN Take 1 tablet by mouth every 4 (four) hours as needed for moderate pain.   methocarbamol 500 MG tablet Commonly known as: Robaxin Take 1 tablet (500 mg total) by mouth 4 (four) times daily.   ondansetron 4 MG disintegrating tablet Commonly known as: Zofran ODT Take 1 tablet (4 mg total) by mouth every 8 (eight)  hours as needed.   promethazine 25 MG tablet Commonly known as: PHENERGAN Take 1 tablet (25 mg total) by mouth every 6 (six) hours as needed for nausea or vomiting.   traMADol 50 MG tablet Commonly known as: ULTRAM Take 1 tablet (50 mg total) by mouth every 6 (six) hours as needed.        Signed: [employer] Klyn Kroening 01/22/2021, 7:55 AM

## 2021-01-22 NOTE — Evaluation (Signed)
Occupational Therapy Evaluation Patient Details Name: Kimberly Bautista MRN: [id number] DOB: January 11, 1991 Today's Date: 01/22/2021    History of Present Illness 30 yo female s/p Lumbar laminectomy and microdiscectomy L5-S1 on the right with microdissection of the right S1 nerve root microscopic discectomy on 4/26. PMH includes ulcerative colitis.   Clinical Impression   Patient evaluated by Occupational Therapy with no further acute OT needs identified. All education has been completed and the patient has no further questions. See below for any follow-up Occupational Therapy or equipment needs. OT to sign off. Thank you for referral.      Follow Up Recommendations  No OT follow up    Equipment Recommendations  None recommended by OT (advised shower seat instead of bench due to bathroom setup)    Recommendations for Other Services       Precautions / Restrictions Precautions Precautions: Back Precaution Booklet Issued: Yes (comment) Precaution Comments: handout provided and reviewed for adls Required Braces or Orthoses: Other Brace Other Brace: no brace needed Restrictions Weight Bearing Restrictions: No      Mobility Bed Mobility Overal bed mobility: Needs Assistance Bed Mobility: Rolling;Supine to Sit;Sit to Supine Rolling: Supervision Sidelying to sit: Supervision   Sit to supine: Supervision Sit to sidelying: Supervision General bed mobility comments: using bed rail. educated on bed sheet use    Transfers Overall transfer level: Modified independent Equipment used: None Transfers: Sit to/from Stand Sit to Stand: Supervision         General transfer comment: for safety, slow to rise and sit    Balance Overall balance assessment: Needs assistance Sitting-balance support: No upper extremity supported;Feet supported Sitting balance-Leahy Scale: Good     Standing balance support: No upper extremity supported;During functional activity Standing balance-Leahy  Scale: Good                             ADL either performed or assessed with clinical judgement   ADL Overall ADL's : Needs assistance/impaired Eating/Feeding: Modified independent   Grooming: Modified independent           Upper Body Dressing : Modified independent   Lower Body Dressing: Minimal assistance Lower Body Dressing Details (indicate cue type and reason): educated on AE due to inability to figure 4 cross and reach feet. pt was able to figure 4 in supine but can not reach foot to complete task Toilet Transfer: Supervision/safety     Toileting - Clothing Manipulation Details (indicate cue type and reason): educated on toilet tongs       General ADL Comments: pt mother plans to purchase reacher, toilet aide and shoe horn for patient. pt wears croc shoes normally and educated on slipping on wet surface and use of heel strap.     Vision Baseline Vision/History: No visual deficits       Perception     Praxis      Pertinent Vitals/Pain Pain Assessment: Faces Pain Score: 5  Faces Pain Scale: Hurts a little bit Pain Location: back Pain Descriptors / Indicators: Discomfort;Sore;Grimacing Pain Intervention(s): Premedicated before session;Repositioned     Hand Dominance Right   Extremity/Trunk Assessment Upper Extremity Assessment Upper Extremity Assessment: Overall WFL for tasks assessed   Lower Extremity Assessment Lower Extremity Assessment: Defer to PT evaluation RLE Sensation: decreased light touch (numbness in foot since surgery, some posterior numbness down leg as well)   Cervical / Trunk Assessment Cervical / Trunk Assessment: Other exceptions Cervical / Trunk Exceptions: s/p  lumbar surgery   Communication Communication Communication: No difficulties   Cognition Arousal/Alertness: Awake/alert Behavior During Therapy: WFL for tasks assessed/performed Overall Cognitive Status: Within Functional Limits for tasks assessed                                      General Comments       Exercises Other Exercises Other Exercises: home walking program: up and walking short distances 1x/hour to decrease stiffness and promote LE circulation   Shoulder Instructions      Home Living Family/patient expects to be discharged to:: Private residence Living Arrangements: Alone Available Help at Discharge: Family Type of Home: House Home Access: Stairs to enter Technical brewer of Steps: 3 Entrance Stairs-Rails: Left Home Layout: One level     Bathroom Shower/Tub: Teacher, early years/pre: Standard     Home Equipment: None   Additional Comments: mother lives next door and works from home so can help. grandmother is coming into town to help with patient as well. Has a cat and dog in the home. The dog is deaf   Back handout provided and reviewed adls in detail. Pt educated on:  avoid sitting for long periods of time, correct bed positioning for sleeping, correct sequence for bed mobility, avoiding lifting more than 5 pounds and never wash directly over incision. All education is complete and patient indicates understanding.      Prior Functioning/Environment Level of Independence: Independent        Comments: Pt working as a Audiological scientist, has had back pain on and off for one year but recently worse        OT Problem List: Decreased knowledge of precautions;Pain      OT Treatment/Interventions:      OT Goals(Current goals can be found in the care plan section) Acute Rehab OT Goals Patient Stated Goal: to go home OT Goal Formulation: With patient/family  OT Frequency:     Barriers to D/C:            Co-evaluation              AM-PAC OT "6 Clicks" Daily Activity     Outcome Measure Help from another person eating meals?: None Help from another person taking care of personal grooming?: None Help from another person toileting, which includes using toliet, bedpan, or urinal?:  None Help from another person bathing (including washing, rinsing, drying)?: None Help from another person to put on and taking off regular upper body clothing?: None Help from another person to put on and taking off regular lower body clothing?: None 6 Click Score: 24   End of Session Nurse Communication: Mobility status;Precautions  Activity Tolerance: Patient tolerated treatment well Patient left: in bed;with call bell/phone within reach  OT Visit Diagnosis: Unsteadiness on feet (R26.81)                Time: 6701-4103 OT Time Calculation (min): 35 min Charges:  OT General Charges $OT Visit: 1 Visit OT Evaluation $OT Eval Moderate Complexity: 1 Mod OT Treatments $Self Care/Home Management : 8-22 mins   Brynn, OTR/L  Acute Rehabilitation Services Pager: [phone] Office: [phone] .   Jeri Modena 01/22/2021, 11:28 AM

## 2021-01-22 NOTE — Discharge Instructions (Signed)
Wound Care  Keep the incision clean and dry remove the outer dressing in 2 days, leave the Steri-Strips intact. Wrap with Saran wrap for showers only Do not put any creams, lotions, or ointments on incision. Leave steri-strips on back.  They will fall off by themselves.  Activity Walk each and every day, increasing distance each day. No lifting greater than 5 lbs.  No lifting no bending no twisting no driving or riding a car unless coming back and forth to see me.  Diet Resume your normal diet.   Return to Work Will be discussed at you follow up appointment.  Call Your Doctor If Any of These Occur Redness, drainage, or swelling at the wound.  Temperature greater than 101 degrees. Severe pain not relieved by pain medication. Incision starts to come apart. Follow Up Appt Call today for appointment in 1-2 weeks ([phone]) or for problems.

## 2021-01-22 NOTE — Progress Notes (Signed)
Pharmacy Antibiotic Note  Kimberly Bautista is a 30 y.o. female admitted on 01/21/2021 with radiculopathy s/p OR.  Pharmacy has been consulted for Vancomycin x 1 dose for surgical prophylaxis. WBC WNL. Renal function good. No post-op drain noted.  Plan: Vancomycin 1250 mg IV x 1 today at 0400  Height: 5' 5"  (165.1 cm) Weight: 102.1 kg (225 lb 1.4 oz) IBW/kg (Calculated) : 57  Temp (24hrs), Avg:98.1 F (36.7 C), Min:97.5 F (36.4 C), Max:98.5 F (36.9 C)  Recent Labs  Lab 01/21/21 0947  WBC 9.6  CREATININE 0.88    Estimated Creatinine Clearance: 111.7 mL/min (by C-G formula based on SCr of 0.88 mg/dL).    Allergies  Allergen Reactions  . Penicillins     Narda Bonds, PharmD, BCPS Clinical Pharmacist Phone: [phone]

## 2021-04-21 ENCOUNTER — Other Ambulatory Visit: Payer: Self-pay | Admitting: Family Medicine

## 2021-04-21 MED ORDER — TRAMADOL HCL 50 MG PO TABS: 50.0000 mg | ORAL_TABLET | Freq: Four times a day (QID) | ORAL | 0 refills | Status: DC | PRN

## 2021-05-02 ENCOUNTER — Other Ambulatory Visit: Payer: Self-pay | Admitting: Family Medicine

## 2021-05-06 ENCOUNTER — Other Ambulatory Visit: Payer: Self-pay | Admitting: Radiology

## 2021-05-09 ENCOUNTER — Other Ambulatory Visit: Payer: Self-pay | Admitting: Family Medicine

## 2021-05-21 ENCOUNTER — Encounter: Payer: Commercial Managed Care - PPO | Admitting: Nurse Practitioner

## 2021-07-28 ENCOUNTER — Encounter: Payer: Self-pay | Admitting: Nurse Practitioner

## 2021-07-28 ENCOUNTER — Ambulatory Visit (INDEPENDENT_AMBULATORY_CARE_PROVIDER_SITE_OTHER): Payer: Commercial Managed Care - PPO | Admitting: Nurse Practitioner

## 2021-07-28 ENCOUNTER — Other Ambulatory Visit (HOSPITAL_COMMUNITY)
Admission: RE | Admit: 2021-07-28 | Discharge: 2021-07-28 | Disposition: A | Discharge status: Home / Self Care | Payer: Commercial Managed Care - PPO | Source: Ambulatory Visit | Attending: Nurse Practitioner | Admitting: Nurse Practitioner

## 2021-07-28 ENCOUNTER — Other Ambulatory Visit: Payer: Self-pay

## 2021-07-28 VITALS — BP 122/76 | Ht 65.0 in | Wt 238.0 lb

## 2021-07-28 DIAGNOSIS — Z113 Encounter for screening for infections with a predominantly sexual mode of transmission: Secondary | ICD-10-CM

## 2021-07-28 DIAGNOSIS — Z01419 Encounter for gynecological examination (general) (routine) without abnormal findings: Secondary | ICD-10-CM

## 2021-07-28 DIAGNOSIS — R87610 Atypical squamous cells of undetermined significance on cytologic smear of cervix (ASC-US): Secondary | ICD-10-CM | POA: Insufficient documentation

## 2021-07-28 DIAGNOSIS — Z01411 Encounter for gynecological examination (general) (routine) with abnormal findings: Secondary | ICD-10-CM | POA: Diagnosis not present

## 2021-07-28 NOTE — Progress Notes (Signed)
   Kimberly Bautista 20-Feb-1991 [id number]   History:  30 y.o. G0 presents as new patient to establish care. Irregular periods, about 2 per year. History of PCOS. Reports abnormal paps in the past. She was unable to obtain records but says she had a biopsy done but never heard back. History of UC, lumbar laminectomy 12/2020.  Gynecologic History Patient's last menstrual period was 10/24/2020 (approximate).   Contraception/Family planning: none Sexually active: Yes  Health Maintenance Last Pap: 2016 per patient. 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   ROS:  A ROS was performed and pertinent positives and negatives are included.  Exam:  Vitals:   07/28/21 1346  BP: 122/76  Weight: 238 lb (108 kg)  Height: 5' 5"  (1.651 m)   Body mass index is 39.61 kg/m.  General appearance:  Normal Thyroid:  Symmetrical, normal in size, without palpable masses or nodularity. Respiratory  Auscultation:  Clear without wheezing or rhonchi Cardiovascular  Auscultation:  Regular rate, without rubs, murmurs or gallops  Edema/varicosities:  Not grossly evident Abdominal  Soft,nontender, without masses, guarding or rebound.  Liver/spleen:  No organomegaly noted  Hernia:  None appreciated  Skin  Inspection:  Grossly normal Breasts: Examined lying and sitting.   Right: Without masses, retractions, nipple discharge or axillary adenopathy.   Left: Without masses, retractions, nipple discharge or axillary adenopathy. Genitourinary   Inguinal/mons:  Normal without inguinal adenopathy  External genitalia:  Normal appearing vulva with no masses, tenderness, or lesions  BUS/Urethra/Skene's  glands:  Normal  Vagina:  Normal appearing with normal color and discharge, no lesions  Cervix:  Normal appearing without discharge or lesions  Uterus:  Difficult to palpate due to body habitus but no gross masses or tenderness  Adnexa/parametria:     Rt: Normal in size, without masses or tenderness.   Lt: Normal in size, without masses or tenderness.  Anus and perineum: Normal  Patient informed chaperone available to be present for breast and pelvic exam. Patient has requested no chaperone to be present. Patient has been advised what will be completed during breast and pelvic exam.   Assessment/Plan:  30 y.o. G0 for annual exam.   Well female exam with routine gynecological exam - Plan: CBC with Differential/Platelet, Comprehensive metabolic panel, TSH, Cytology - PAP( [place]). Education provided on SBEs, importance of preventative screenings, current guidelines, high calcium diet, regular exercise, and multivitamin daily.   Screen for STD (sexually transmitted disease) - Plan: Cytology - PAP( [place]). GC/chlamydia/trich added to pap.   Screening for cervical cancer  - Reports abnormal paps in the past. She was unable to obtain records but says she had a biopsy done but never heard back. Pap today.   Return in 1 year for annual.   Tamela Gammon DNP, 2:06 PM 07/28/2021

## 2021-07-29 LAB — CBC WITH DIFFERENTIAL/PLATELET
Absolute Monocytes: 342 cells/uL (ref 200–950)
Basophils Absolute: 80 cells/uL (ref 0–200)
Basophils Relative: 1.4 %
Eosinophils Absolute: 171 cells/uL (ref 15–500)
Eosinophils Relative: 3 %
HCT: 42.1 % (ref 35.0–45.0)
Hemoglobin: 14.1 g/dL (ref 11.7–15.5)
Lymphs Abs: 2257 cells/uL (ref 850–3900)
MCH: 29.3 pg (ref 27.0–33.0)
MCHC: 33.5 g/dL (ref 32.0–36.0)
MCV: 87.5 fL (ref 80.0–100.0)
MPV: 9.8 fL (ref 7.5–12.5)
Monocytes Relative: 6 %
Neutro Abs: 2850 cells/uL (ref 1500–7800)
Neutrophils Relative %: 50 %
Platelets: 292 10*3/uL (ref 140–400)
RBC: 4.81 10*6/uL (ref 3.80–5.10)
RDW: 12.2 % (ref 11.0–15.0)
Total Lymphocyte: 39.6 %
WBC: 5.7 10*3/uL (ref 3.8–10.8)

## 2021-07-29 LAB — TSH: TSH: 1.28 mIU/L

## 2021-07-29 LAB — COMPREHENSIVE METABOLIC PANEL
AG Ratio: 2 (calc) (ref 1.0–2.5)
ALT: 20 U/L (ref 6–29)
AST: 14 U/L (ref 10–30)
Albumin: 4.5 g/dL (ref 3.6–5.1)
Alkaline phosphatase (APISO): 51 U/L (ref 31–125)
BUN: 8 mg/dL (ref 7–25)
CO2: 25 mmol/L (ref 20–32)
Calcium: 9.2 mg/dL (ref 8.6–10.2)
Chloride: 106 mmol/L (ref 98–110)
Creat: 0.74 mg/dL (ref 0.50–0.97)
Globulin: 2.2 g/dL (calc) (ref 1.9–3.7)
Glucose, Bld: 100 mg/dL — ABNORMAL HIGH (ref 65–99)
Potassium: 3.8 mmol/L (ref 3.5–5.3)
Sodium: 141 mmol/L (ref 135–146)
Total Bilirubin: 0.4 mg/dL (ref 0.2–1.2)
Total Protein: 6.7 g/dL (ref 6.1–8.1)

## 2021-08-07 LAB — CYTOLOGY - PAP
Adequacy: ABSENT
Chlamydia: NEGATIVE
Comment: NEGATIVE
Comment: NEGATIVE
Comment: NEGATIVE
Comment: NORMAL
Diagnosis: UNDETERMINED — AB
High risk HPV: NEGATIVE
Neisseria Gonorrhea: NEGATIVE
Trichomonas: NEGATIVE

## 2022-07-29 NOTE — Progress Notes (Deleted)
   Leslea Vowles 12-23-1990 [id number]   History:  31 y.o. G0 presents for annual exam. Irregular periods, about 2 per year. History of PCOS. Reports abnormal paps in the past. She was unable to obtain records but says she had a biopsy done but never heard back. History of UC.   Gynecologic History No LMP recorded. (Menstrual status: Irregular Periods).   Contraception/Family planning: none Sexually active: Yes  Health Maintenance Last Pap: 07/28/2021. 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   ROS:  A ROS was performed and pertinent positives and negatives are included.  Exam:  There were no vitals filed for this visit.  There is no height or weight on file to calculate BMI.  General appearance:  Normal Thyroid:  Symmetrical, normal in size, without palpable masses or nodularity. Respiratory  Auscultation:  Clear without wheezing or rhonchi Cardiovascular  Auscultation:  Regular rate, without rubs, murmurs or gallops  Edema/varicosities:  Not grossly evident Abdominal  Soft,nontender, without masses, guarding or rebound.  Liver/spleen:  No organomegaly noted  Hernia:  None appreciated  Skin  Inspection:  Grossly normal Breasts: Examined lying and sitting.   Right: Without masses, retractions, nipple discharge or axillary adenopathy.   Left: Without masses, retractions, nipple discharge or axillary adenopathy. Genitourinary   Inguinal/mons:  Normal without inguinal adenopathy  External genitalia:  Normal appearing vulva with no masses, tenderness, or lesions  BUS/Urethra/Skene's glands:  Normal  Vagina:  Normal appearing with normal  color and discharge, no lesions  Cervix:  Normal appearing without discharge or lesions  Uterus:  Difficult to palpate due to body habitus but no gross masses or tenderness  Adnexa/parametria:     Rt: Normal in size, without masses or tenderness.   Lt: Normal in size, without masses or tenderness.  Anus and perineum: Normal  Patient informed chaperone available to be present for breast and pelvic exam. Patient has requested no chaperone to be present. Patient has been advised what will be completed during breast and pelvic exam.   Assessment/Plan:  31 y.o. G0 for annual exam.   Well female exam with routine gynecological exam - Plan: CBC with Differential/Platelet, Comprehensive metabolic panel, TSH. Education provided on SBEs, importance of preventative screenings, current guidelines, high calcium diet, regular exercise, and multivitamin daily.   Screening for cervical cancer  - Reports abnormal paps in the past. 06/2021 ASCUS neg HPV. Will repeat at 3-year interval per guidelines.    Return in 1 year for annual.     Tamela Gammon DNP, 2:50 PM 07/29/2022

## 2022-07-30 ENCOUNTER — Ambulatory Visit: Payer: Commercial Managed Care - PPO | Admitting: Nurse Practitioner

## 2022-07-30 DIAGNOSIS — Z0289 Encounter for other administrative examinations: Secondary | ICD-10-CM

## 2022-07-30 DIAGNOSIS — Z01419 Encounter for gynecological examination (general) (routine) without abnormal findings: Secondary | ICD-10-CM

## 2022-07-30 DIAGNOSIS — E282 Polycystic ovarian syndrome: Secondary | ICD-10-CM

## 2023-01-15 ENCOUNTER — Ambulatory Visit (INDEPENDENT_AMBULATORY_CARE_PROVIDER_SITE_OTHER): Payer: Commercial Managed Care - PPO | Admitting: Family Medicine

## 2023-01-15 ENCOUNTER — Encounter: Payer: Self-pay | Admitting: Family Medicine

## 2023-01-15 VITALS — BP 148/78 | HR 84 | Ht 65.0 in | Wt 255.1 lb

## 2023-01-15 DIAGNOSIS — Z0001 Encounter for general adult medical examination with abnormal findings: Secondary | ICD-10-CM

## 2023-01-15 DIAGNOSIS — Z8639 Personal history of other endocrine, nutritional and metabolic disease: Secondary | ICD-10-CM

## 2023-01-15 DIAGNOSIS — G4700 Insomnia, unspecified: Secondary | ICD-10-CM | POA: Insufficient documentation

## 2023-01-15 DIAGNOSIS — F320 Major depressive disorder, single episode, mild: Secondary | ICD-10-CM | POA: Insufficient documentation

## 2023-01-15 DIAGNOSIS — R7301 Impaired fasting glucose: Secondary | ICD-10-CM | POA: Diagnosis not present

## 2023-01-15 DIAGNOSIS — Z114 Encounter for screening for human immunodeficiency virus [HIV]: Secondary | ICD-10-CM

## 2023-01-15 DIAGNOSIS — E7849 Other hyperlipidemia: Secondary | ICD-10-CM | POA: Diagnosis not present

## 2023-01-15 DIAGNOSIS — Z1159 Encounter for screening for other viral diseases: Secondary | ICD-10-CM

## 2023-01-15 DIAGNOSIS — F5101 Primary insomnia: Secondary | ICD-10-CM

## 2023-01-15 DIAGNOSIS — E559 Vitamin D deficiency, unspecified: Secondary | ICD-10-CM | POA: Diagnosis not present

## 2023-01-15 DIAGNOSIS — I10 Essential (primary) hypertension: Secondary | ICD-10-CM

## 2023-01-15 DIAGNOSIS — E0789 Other specified disorders of thyroid: Secondary | ICD-10-CM

## 2023-01-15 MED ORDER — TRAZODONE HCL 50 MG PO TABS: 25.0000 mg | ORAL_TABLET | Freq: Every evening | ORAL | 3 refills | Status: DC | PRN

## 2023-01-15 NOTE — Assessment & Plan Note (Signed)
Vitals:   01/15/23 1439 01/15/23 1443  BP: (!) 140/103 (!) 148/78  Patient reported history of high cortisol levels in the past Labs ordered, Discussed to follow a DASH diet which includes vegetables,fruits,whole grains, fat free or low fat diary,fish,poultry,beans,nuts and seeds,vegetable oils. Find an activity that you will enjoy and start to be active at least 5 days a week for 30 minutes each day.  Follow up in 4 weeks with at home blood pressure readings

## 2023-01-15 NOTE — Assessment & Plan Note (Signed)
Patient reports sleeping between 1-4 hours a days, works night shift as paramedic- frequent awakening, related to work and family stress. Denies drinking caffeine. Patient has tried melatonin PRN with no relief. Likes sleeping in a darken environment with white noise.  Trial on Trazodone 25 mg    Explained to go to bed at the same time each night and get up at the same time each morning, including on the weekends. Make sure your bedroom is quiet, dark, relaxing, and at a comfortable temperature. Remove electronic devices, such as TVs, computers, and smart phones, from the bedroom.

## 2023-01-15 NOTE — Patient Instructions (Signed)
It was pleasure meeting with you today. Follow up with your primary health provider if any health concerns arises.   

## 2023-01-15 NOTE — Assessment & Plan Note (Signed)
Flowsheet Row Office Visit from 01/15/2023 in [facility]  PHQ-9 Total Score 14     Patient denied medication intervention and would like to try lifestyle changes. Discussed about cognitive behavioral therapy focusing on thoughts, belief, and attitudes that affects feelings and behavior, learning about coping skills to deal with certain problems. Maintaining a consistent routine and schedule, Practice stress management and self calming techniques, excersise regularly and spend time outdoors, Do not eat food that are high in fat, added sugar, or salt.

## 2023-01-15 NOTE — Progress Notes (Signed)
Complete physical exam  Patient: Kimberly Bautista   DOB: 27-Jun-1991   31 y.o. Female  MRN: [id number]  Subjective:    Chief Complaint  Patient presents with   Establish Care    New patient establishing care, no complaints needing a pcp.     Kimberly Bautista is a 32 y.o. female who presents today for a complete physical exam. She reports consuming a general diet. Gym/ health club routine includes light weights and treadmill. She generally feels tired. She reports sleeping poorly. She does have additional problems to discuss today.    Most recent fall risk assessment:    01/15/2023    2:42 PM  Fall Risk   Falls in the past year? 0  Number falls in past yr: 0  Injury with Fall? 0  Risk for fall due to : No Fall Risks  Follow up Falls evaluation completed     Most recent depression screenings:    01/15/2023    2:42 PM  PHQ 2/9 Scores  PHQ - 2 Score 2  PHQ- 9 Score 14    Vision:Within last year and Dental: No current dental problems and Receives regular dental care  Patient Care Team: Del Newman Nip, Tenna Child, FNP as PCP - General (Family Medicine)   Outpatient Medications Prior to Visit  Medication Sig   cetirizine (ZYRTEC) 5 MG tablet Take 5 mg by mouth daily.   [DISCONTINUED] cetirizine (ZYRTEC) 10 MG tablet Take 10 mg by mouth daily.   [DISCONTINUED] famotidine (PEPCID) 20 MG tablet Take 1 tablet (20 mg total) by mouth 2 (two) times daily.   [DISCONTINUED] methocarbamol (ROBAXIN) 500 MG tablet Take 1 tablet (500 mg total) by mouth 4 (four) times daily. (Patient not taking: Reported on 07/28/2021)   [DISCONTINUED] ondansetron (ZOFRAN ODT) 4 MG disintegrating tablet Take 1 tablet (4 mg total) by mouth every 8 (eight) hours as needed.   [DISCONTINUED] pantoprazole (PROTONIX) 40 MG tablet Take by mouth.   [DISCONTINUED] promethazine (PHENERGAN) 25 MG tablet Take 1 tablet (25 mg total) by mouth every 6 (six) hours as needed for nausea or vomiting.   [DISCONTINUED] traMADol  (ULTRAM) 50 MG tablet Take 1 tablet (50 mg total) by mouth every 6 (six) hours as needed. (Patient not taking: Reported on 07/28/2021)   No facility-administered medications prior to visit.    Review of Systems  Constitutional:  Negative for chills and fever.  HENT:  Negative for ear pain and tinnitus.   Eyes:  Negative for blurred vision.  Respiratory:  Negative for shortness of breath.   Cardiovascular:  Negative for chest pain.  Gastrointestinal:  Negative for abdominal pain, nausea and vomiting.  Genitourinary:  Negative for dysuria.  Musculoskeletal:  Negative for myalgias.  Skin:  Negative for rash.  Neurological:  Negative for dizziness and headaches.  Psychiatric/Behavioral:  Positive for depression. Negative for suicidal ideas. The patient is nervous/anxious.        Objective:    BP (!) 148/78 (BP Location: Left Arm)   Pulse 84   Ht  (1.651 m)   Wt 255 lb 1.9 oz (115.7 kg)   SpO2 97%   BMI 42.45 kg/m  BP Readings from Last 3 Encounters:  01/15/23 (!) 148/78  07/28/21 122/76  01/22/21 120/71      Physical Exam Vitals reviewed.  Constitutional:      General: She is not in acute distress.    Appearance: She is obese. She is not ill-appearing, toxic-appearing or diaphoretic.  HENT:  Head: Normocephalic.     Right Ear: Tympanic membrane normal.     Left Ear: Tympanic membrane normal.     Nose: Nose normal.     Mouth/Throat:     Mouth: Mucous membranes are moist.  Eyes:     General:        Right eye: No discharge.        Left eye: No discharge.     Conjunctiva/sclera: Conjunctivae normal.     Pupils: Pupils are equal, round, and reactive to light.  Cardiovascular:     Rate and Rhythm: Normal rate.     Pulses: Normal pulses.     Heart sounds: Normal heart sounds.  Pulmonary:     Effort: Pulmonary effort is normal. No respiratory distress.     Breath sounds: Normal breath sounds.  Abdominal:     General: Bowel sounds are normal.     Palpations:  Abdomen is soft.     Tenderness: There is no abdominal tenderness. There is no right CVA tenderness, left CVA tenderness or guarding.  Musculoskeletal:        General: Normal range of motion.     Cervical back: Normal range of motion.  Skin:    General: Skin is warm and dry.     Capillary Refill: Capillary refill takes less than 2 seconds.  Neurological:     General: No focal deficit present.     Mental Status: She is alert and oriented to person, place, and time.     Coordination: Coordination normal.     Gait: Gait normal.  Psychiatric:        Mood and Affect: Mood normal.      No results found for any visits on 01/15/23.    Assessment & Plan:    Routine Health Maintenance and Physical Exam  Immunization History  Administered Date(s) Administered   DTaP 06/29/1991, 11/02/1991, 01/20/1993, 05/25/1994   HIB, Unspecified 06/29/1991, 11/22/1991, 01/20/1993   HPV Quadrivalent 05/13/2006, 02/09/2007, 06/23/2007   Hepatitis A, Ped/Adol-2 Dose 05/13/2006, 02/09/2007   Hepatitis B, PED/ADOLESCENT 05/25/1994, 09/10/1995, 12/02/1995, 06/09/2002, 07/21/2002   IPV 06/29/1991, 11/02/1991, 01/20/1993, 09/10/1995   Influenza-Unspecified 09/01/2018   MMR 01/20/1993, 09/10/1995   Meningococcal Acwy, Unspecified 05/13/2006   PPD Test 09/05/2018, 09/16/2018   Td 04/30/2005   Tdap 09/01/2018    Health Maintenance  Topic Date Due   COVID-19 Vaccine (1) Never done   HIV Screening  Never done   Hepatitis C Screening  Never done   INFLUENZA VACCINE  04/29/2023   PAP SMEAR-Modifier  07/28/2024   DTaP/Tdap/Td (7 - Td or Tdap) 09/01/2028   HPV VACCINES  Completed    Discussed health benefits of physical activity, and encouraged her to engage in regular exercise appropriate for her age and condition.  Vitamin D deficiency -     VITAMIN D 25 Hydroxy (Vit-D Deficiency, Fractures)  Impaired fasting blood sugar -     Hemoglobin A1c  Encounter for hepatitis C screening test for low risk  patient -     Hepatitis C antibody  Other hyperlipidemia -     CBC with Differential/Platelet -     CMP14+EGFR -     Lipid panel  Other specified disorders of thyroid -     TSH + free T4  Screening for HIV (human immunodeficiency virus) -     HIV Antibody (routine testing w rflx)  Primary insomnia Assessment & Plan: Patient reports sleeping between 1-4 hours a days, works night shift as paramedic- frequent  awakening, related to work and family stress. Denies drinking caffeine. Patient has tried melatonin PRN with no relief. Likes sleeping in a darken environment with white noise.  Trial on Trazodone 25 mg    Explained to go to bed at the same time each night and get up at the same time each morning, including on the weekends. Make sure your bedroom is quiet, dark, relaxing, and at a comfortable temperature. Remove electronic devices, such as TVs, computers, and smart phones, from the bedroom.   Orders: -     traZODone HCl; Take 0.5 tablets (25 mg total) by mouth at bedtime as needed for sleep.  Dispense: 30 tablet; Refill: 3  Hx of Cushing's syndrome -     Cortisol  Initial high blood pressure determined by examination Assessment & Plan: Vitals:   01/15/23 1439 01/15/23 1443  BP: (!) 140/103 (!) 148/78  Patient reported history of high cortisol levels in the past Labs ordered, Discussed to follow a DASH diet which includes vegetables,fruits,whole grains, fat free or low fat diary,fish,poultry,beans,nuts and seeds,vegetable oils. Find an activity that you will enjoy and start to be active at least 5 days a week for 30 minutes each day.  Follow up in 4 weeks with at home blood pressure readings   Depression, major, single episode, mild Assessment & Plan: Flowsheet Row Office Visit from 01/15/2023 in [facility]  PHQ-9 Total Score 14     Patient denied medication intervention and would like to try lifestyle changes. Discussed about cognitive behavioral  therapy focusing on thoughts, belief, and attitudes that affects feelings and behavior, learning about coping skills to deal with certain problems. Maintaining a consistent routine and schedule, Practice stress management and self calming techniques, excersise regularly and spend time outdoors, Do not eat food that are high in fat, added sugar, or salt.      Return in about 1 month (around 02/14/2023) for re-check blood pressure, labs.     [name], FNP

## 2023-01-29 LAB — CBC WITH DIFFERENTIAL/PLATELET
Basophils Absolute: 0.1 10*3/uL (ref 0.0–0.2)
Basos: 1 %
EOS (ABSOLUTE): 0.1 10*3/uL (ref 0.0–0.4)
Eos: 2 %
Hematocrit: 41.9 % (ref 34.0–46.6)
Hemoglobin: 14.3 g/dL (ref 11.1–15.9)
Immature Grans (Abs): 0 10*3/uL (ref 0.0–0.1)
Immature Granulocytes: 0 %
Lymphocytes Absolute: 2.5 10*3/uL (ref 0.7–3.1)
Lymphs: 39 %
MCH: 29.4 pg (ref 26.6–33.0)
MCHC: 34.1 g/dL (ref 31.5–35.7)
MCV: 86 fL (ref 79–97)
Monocytes Absolute: 0.4 10*3/uL (ref 0.1–0.9)
Monocytes: 6 %
Neutrophils Absolute: 3.3 10*3/uL (ref 1.4–7.0)
Neutrophils: 52 %
Platelets: 293 10*3/uL (ref 150–450)
RBC: 4.87 x10E6/uL (ref 3.77–5.28)
RDW: 12.4 % (ref 11.7–15.4)
WBC: 6.5 10*3/uL (ref 3.4–10.8)

## 2023-01-29 LAB — CMP14+EGFR
ALT: 44 IU/L — ABNORMAL HIGH (ref 0–32)
AST: 28 IU/L (ref 0–40)
Albumin/Globulin Ratio: 1.8 (ref 1.2–2.2)
Albumin: 4.3 g/dL (ref 3.9–4.9)
Alkaline Phosphatase: 65 IU/L (ref 44–121)
BUN/Creatinine Ratio: 15 (ref 9–23)
BUN: 11 mg/dL (ref 6–20)
Bilirubin Total: 0.4 mg/dL (ref 0.0–1.2)
CO2: 20 mmol/L (ref 20–29)
Calcium: 9.1 mg/dL (ref 8.7–10.2)
Chloride: 100 mmol/L (ref 96–106)
Creatinine, Ser: 0.73 mg/dL (ref 0.57–1.00)
Globulin, Total: 2.4 g/dL (ref 1.5–4.5)
Glucose: 90 mg/dL (ref 70–99)
Potassium: 4 mmol/L (ref 3.5–5.2)
Sodium: 139 mmol/L (ref 134–144)
Total Protein: 6.7 g/dL (ref 6.0–8.5)
eGFR: 113 mL/min/{1.73_m2} (ref >59)

## 2023-01-29 LAB — TSH+FREE T4
Free T4: 0.87 ng/dL (ref 0.82–1.77)
TSH: 1.38 u[IU]/mL (ref 0.450–4.500)

## 2023-01-29 LAB — LIPID PANEL
Chol/HDL Ratio: 6.4 ratio — ABNORMAL HIGH (ref 0.0–4.4)
Cholesterol, Total: 243 mg/dL — ABNORMAL HIGH (ref 100–199)
HDL: 38 mg/dL — ABNORMAL LOW (ref >39)
LDL Chol Calc (NIH): 158 mg/dL — ABNORMAL HIGH (ref 0–99)
Triglycerides: 253 mg/dL — ABNORMAL HIGH (ref 0–149)
VLDL Cholesterol Cal: 47 mg/dL — ABNORMAL HIGH (ref 5–40)

## 2023-01-29 LAB — HEPATITIS C ANTIBODY: Hep C Virus Ab: NONREACTIVE

## 2023-01-29 LAB — HEMOGLOBIN A1C
Est. average glucose Bld gHb Est-mCnc: 123 mg/dL
Hgb A1c MFr Bld: 5.9 % — ABNORMAL HIGH (ref 4.8–5.6)

## 2023-01-29 LAB — HIV ANTIBODY (ROUTINE TESTING W REFLEX): HIV Screen 4th Generation wRfx: NONREACTIVE

## 2023-01-29 LAB — VITAMIN D 25 HYDROXY (VIT D DEFICIENCY, FRACTURES): Vit D, 25-Hydroxy: 21.7 ng/mL — ABNORMAL LOW (ref 30.0–100.0)

## 2023-02-15 ENCOUNTER — Ambulatory Visit (INDEPENDENT_AMBULATORY_CARE_PROVIDER_SITE_OTHER): Payer: Commercial Managed Care - PPO | Admitting: Family Medicine

## 2023-02-15 ENCOUNTER — Encounter: Payer: Self-pay | Admitting: Family Medicine

## 2023-02-15 VITALS — BP 128/90 | HR 67 | Ht 65.0 in | Wt 248.0 lb

## 2023-02-15 DIAGNOSIS — I10 Essential (primary) hypertension: Secondary | ICD-10-CM

## 2023-02-15 DIAGNOSIS — E240 Pituitary-dependent Cushing's disease: Secondary | ICD-10-CM

## 2023-02-15 NOTE — Assessment & Plan Note (Addendum)
Vitals:   02/15/23 0815 02/15/23 0816  BP: (!) 130/93 (!) 128/90  Slightly elevated blood pressure in today's visit Patient denied medication intervention and would like in incorporate lifestyle changes.         Explained non pharmacological interventions such as low salt, DASH diet discussed. Educated on stress reduction and physical activity minimum 150 minutes per week. Discussed signs and symptoms of major cardiovascular event and need to present to the ED. Follow up in 3 months or sooner if needed with blood pressure readings. Patient verbalizes understanding regarding plan of care and all questions answered.

## 2023-02-15 NOTE — Patient Instructions (Signed)

## 2023-02-15 NOTE — Progress Notes (Signed)
Patient Office Visit   Subjective   Patient ID: Kimberly Bautista, female    DOB: September 25, 1991  Age: 32 y.o. MRN: [id number]  CC:  Chief Complaint  Patient presents with   Hypertension    Patient is here for HTN f/u, no issues or concerns since last visit.     HPI Kimberly Bautista 32 year old female, presents to the clinic for elevated blood pressure follow up. She  has a past medical history of DDD (degenerative disc disease), lumbar, PCOS (polycystic ovarian syndrome), and Ulcerative colitis (HCC).  Hypertension: Patient here for follow-up of elevated blood pressure. She is exercising and is adherent to low salt diet.  Blood pressure is well controlled at home. Patient denies cardiac symptoms chest pain, chest pressure/discomfort, dyspnea, exertional chest pressure/discomfort, lower extremity edema, and palpitations. Cardiovascular risk factors: dyslipidemia, obesity (BMI >= 30 kg/m2), sedentary lifestyle, and smoking/ tobacco exposure.     Outpatient Encounter Medications as of 02/15/2023  Medication Sig   cetirizine (ZYRTEC) 5 MG tablet Take 5 mg by mouth daily.   traZODone (DESYREL) 50 MG tablet Take 0.5 tablets (25 mg total) by mouth at bedtime as needed for sleep.   No facility-administered encounter medications on file as of 02/15/2023.    Past Surgical History:  Procedure Laterality Date   LUMBAR LAMINECTOMY/DECOMPRESSION MICRODISCECTOMY Right 01/21/2021   Procedure: RIGHT LUMBAR FIVE-SACRAL ONE LUMBAR LAMINECTOMY/DECOMPRESSION MICRODISCECTOMY;  Surgeon: Donalee Citrin, MD;  Location: [facility] OR;  Service: Neurosurgery;  Laterality: Right;   TONSILLECTOMY      Review of Systems  Constitutional:  Negative for chills and fever.  Respiratory:  Negative for shortness of breath.   Cardiovascular:  Negative for chest pain.  Gastrointestinal:  Negative for abdominal pain.  Genitourinary:  Negative for dysuria.  Skin:  Negative for rash.  Neurological:  Negative for dizziness and  headaches.      Objective    BP (!) 128/90   Pulse 67   Ht 5\' 5"  (1.651 m)   Wt 248 lb (112.5 kg)   SpO2 97%   BMI 41.27 kg/m   Physical Exam Vitals reviewed.  Constitutional:      General: She is not in acute distress.    Appearance: Normal appearance. She is not ill-appearing, toxic-appearing or diaphoretic.  HENT:     Head: Normocephalic.  Eyes:     General:        Right eye: No discharge.        Left eye: No discharge.     Conjunctiva/sclera: Conjunctivae normal.  Cardiovascular:     Rate and Rhythm: Normal rate.     Pulses: Normal pulses.     Heart sounds: Normal heart sounds.  Pulmonary:     Effort: Pulmonary effort is normal. No respiratory distress.     Breath sounds: Normal breath sounds.  Musculoskeletal:        General: Normal range of motion.     Cervical back: Normal range of motion.  Skin:    General: Skin is warm and dry.     Capillary Refill: Capillary refill takes less than 2 seconds.  Neurological:     General: No focal deficit present.     Mental Status: She is alert and oriented to person, place, and time.     Coordination: Coordination normal.     Gait: Gait normal.  Psychiatric:        Mood and Affect: Mood normal.        Behavior: Behavior normal.  Assessment & Plan:  Cushing disease (HCC) -     Cortisol  Initial high blood pressure determined by examination Assessment & Plan: Vitals:   02/15/23 0815 02/15/23 0816  BP: (!) 130/93 (!) 128/90  Slightly elevated blood pressure in today's visit Patient denied medication intervention and would like in incorporate lifestyle changes.         Explained non pharmacological interventions such as low salt, DASH diet discussed. Educated on stress reduction and physical activity minimum 150 minutes per week. Discussed signs and symptoms of major cardiovascular event and need to present to the ED. Follow up in 3 months or sooner if needed with blood pressure readings. Patient verbalizes  understanding regarding plan of care and all questions answered.      Return in about 4 months (around 06/18/2023), or if symptoms worsen or fail to improve, for routine labs, blood pressure readings.   [name], FNP

## 2023-02-18 LAB — CORTISOL: Cortisol: 9.2 ug/dL (ref 6.2–19.4)

## 2023-06-18 ENCOUNTER — Ambulatory Visit: Payer: Commercial Managed Care - PPO | Admitting: Family Medicine

## 2023-07-02 ENCOUNTER — Ambulatory Visit: Payer: Commercial Managed Care - PPO | Admitting: Family Medicine

## 2023-07-02 ENCOUNTER — Encounter: Payer: Self-pay | Admitting: Family Medicine

## 2023-07-02 VITALS — BP 132/94 | HR 98 | Temp 97.0°F | Ht 65.0 in | Wt 248.0 lb

## 2023-07-02 DIAGNOSIS — H6691 Otitis media, unspecified, right ear: Secondary | ICD-10-CM | POA: Diagnosis not present

## 2023-07-02 MED ORDER — ALBUTEROL SULFATE HFA 108 (90 BASE) MCG/ACT IN AERS: 2.0000 | INHALATION_SPRAY | Freq: Four times a day (QID) | RESPIRATORY_TRACT | 2 refills | Status: AC | PRN

## 2023-07-02 MED ORDER — PREDNISONE 20 MG PO TABS: 20.0000 mg | ORAL_TABLET | Freq: Two times a day (BID) | ORAL | 0 refills | Status: AC

## 2023-07-02 MED ORDER — NOREL AD 4-10-325 MG PO TABS: ORAL_TABLET | ORAL | 0 refills | Status: DC

## 2023-07-02 MED ORDER — AZITHROMYCIN 250 MG PO TABS: ORAL_TABLET | ORAL | 0 refills | Status: DC

## 2023-07-02 NOTE — Assessment & Plan Note (Signed)
Azithromycin 250 mg twice daily x 5 days, Prednisone 20 mg twice daily x 5 days, Albuterol as needed PRN for SOB Advise Symptomatic treatment, rest, increase oral fluid intake. Take OTC tylenol for fever or pain. Follow-up for worsening or persistent symptoms. Patient verbalizes understanding regarding plan of care and all questions answered

## 2023-07-02 NOTE — Patient Instructions (Signed)
        Great to see you today.  I have refilled the medication(s) we provide.    - Please take medications as prescribed. - Follow up with your primary health provider if any health concerns arises. - If symptoms worsen please contact your primary care provider and/or visit the emergency department.  

## 2023-07-02 NOTE — Progress Notes (Signed)
Patient Office Visit   Subjective   Patient ID: Niralya Spratt, female    DOB: [birth year]  Age: 32 y.o. MRN: [id number]  CC:  Chief Complaint  Patient presents with   Cough    Patient complains of sore throat, ears hurting, congestion, cough, starting 6 days ago.     HPI Calise Bute 32 year old female, presents to clinic for worsening right ear pain started 6 days ago. She  has a past medical history of DDD (degenerative disc disease), lumbar, PCOS (polycystic ovarian syndrome), and Ulcerative colitis (HCC).  Patient complains right ear pain. Patient describes symptoms chest congestion,shortness of breath on exertion, chills without rigors, fatigue, low grade, fever,  headache, malaise, and sore throat. Symptoms began 6 days ago and are gradually worsening since that time. Patient denies chest pain or nausea and vomiting. Treatment thus far includes anti-tussive: somewhat effective. Past pulmonary history is significant for no history of pneumonia or bronchitis       Outpatient Encounter Medications as of 07/02/2023  Medication Sig   albuterol (VENTOLIN HFA) 108 (90 Base) MCG/ACT inhaler Inhale 2 puffs into the lungs every 6 (six) hours as needed for wheezing or shortness of breath.   azithromycin (ZITHROMAX) 250 MG tablet Take 2 tablets on day 1, then 1 tablet daily on days 2 through 5   Chlorphen-PE-Acetaminophen (NOREL AD) 4-10-325 MG TABS Take 1 tablet every hours while symptoms persists . Do not take more than 6 tablets in 24 hours   predniSONE (DELTASONE) 20 MG tablet Take 1 tablet (20 mg total) by mouth 2 (two) times daily with a meal for 5 days.   traZODone (DESYREL) 50 MG tablet Take 0.5 tablets (25 mg total) by mouth at bedtime as needed for sleep.   [DISCONTINUED] cetirizine (ZYRTEC) 5 MG tablet Take 5 mg by mouth daily.   No facility-administered encounter medications on file as of 07/02/2023.    Past Surgical History:  Procedure Laterality Date   LUMBAR  LAMINECTOMY/DECOMPRESSION MICRODISCECTOMY Right 01/21/2021   Procedure: RIGHT LUMBAR FIVE-SACRAL ONE LUMBAR LAMINECTOMY/DECOMPRESSION MICRODISCECTOMY;  Surgeon: Donalee Citrin, MD;  Location: [facility] OR;  Service: Neurosurgery;  Laterality: Right;   TONSILLECTOMY      Review of Systems  Constitutional:  Positive for chills, fever and malaise/fatigue. Negative for diaphoresis.  HENT:  Positive for congestion, ear pain, sinus pain and sore throat. Negative for ear discharge.   Eyes:  Negative for blurred vision, pain and discharge.  Respiratory:  Positive for cough and shortness of breath. Negative for hemoptysis and sputum production.   Gastrointestinal:  Negative for abdominal pain.  Genitourinary:  Negative for dysuria.  Neurological:  Positive for headaches. Negative for dizziness.      Objective    BP (!) 132/94   Pulse 98   Temp (!) 97 F (36.1 C)   Ht 5\' 5"  (1.651 m)   Wt 248 lb (112.5 kg)   SpO2 98%   BMI 41.27 kg/m   Physical Exam Vitals reviewed.  Constitutional:      General: She is not in acute distress.    Appearance: Normal appearance. She is not ill-appearing, toxic-appearing or diaphoretic.  HENT:     Head: Normocephalic.     Right Ear: Tenderness present. A middle ear effusion is present. Tympanic membrane is erythematous.     Left Ear: Tenderness present. Tympanic membrane is not erythematous.     Mouth/Throat:     Pharynx: Posterior oropharyngeal erythema present.  Eyes:     General:  Right eye: No discharge.        Left eye: No discharge.     Conjunctiva/sclera: Conjunctivae normal.  Cardiovascular:     Rate and Rhythm: Normal rate.     Pulses: Normal pulses.     Heart sounds: Normal heart sounds.  Pulmonary:     Breath sounds: Rhonchi present.  Abdominal:     General: Bowel sounds are normal.     Palpations: Abdomen is soft.     Tenderness: There is no abdominal tenderness. There is no right CVA tenderness, left CVA tenderness or guarding.   Musculoskeletal:        General: Normal range of motion.     Cervical back: Normal range of motion.  Skin:    General: Skin is warm and dry.     Capillary Refill: Capillary refill takes less than 2 seconds.  Neurological:     General: No focal deficit present.     Mental Status: She is alert and oriented to person, place, and time.     Coordination: Coordination normal.     Gait: Gait normal.  Psychiatric:        Mood and Affect: Mood normal.        Behavior: Behavior normal.       Assessment & Plan:  Acute bacterial infection of right middle ear Assessment & Plan: Azithromycin 250 mg twice daily x 5 days, Prednisone 20 mg twice daily x 5 days, Albuterol as needed PRN for SOB Advise Symptomatic treatment, rest, increase oral fluid intake. Take OTC tylenol for fever or pain. Follow-up for worsening or persistent symptoms. Patient verbalizes understanding regarding plan of care and all questions answered    Orders: -     predniSONE; Take 1 tablet (20 mg total) by mouth 2 (two) times daily with a meal for 5 days.  Dispense: 10 tablet; Refill: 0 -     Azithromycin; Take 2 tablets on day 1, then 1 tablet daily on days 2 through 5  Dispense: 6 tablet; Refill: 0 -     Albuterol Sulfate HFA; Inhale 2 puffs into the lungs every 6 (six) hours as needed for wheezing or shortness of breath.  Dispense: 8 g; Refill: 2 -     Norel AD; Take 1 tablet every hours while symptoms persists . Do not take more than 6 tablets in 24 hours  Dispense: 20 tablet; Refill: 0    Return if symptoms worsen or fail to improve.   [name], FNP

## 2023-07-08 ENCOUNTER — Ambulatory Visit: Payer: Commercial Managed Care - PPO | Admitting: Family Medicine

## 2023-08-11 NOTE — Progress Notes (Unsigned)
   Established Patient Office Visit   Subjective  Patient ID: Kimberly Bautista, female    DOB: January 10, 1991  Age: 32 y.o. MRN: [id number]  No chief complaint on file.   She  has a past medical history of DDD (degenerative disc disease), lumbar, PCOS (polycystic ovarian syndrome), and Ulcerative colitis (HCC).  HPI  ROS    Objective:     There were no vitals taken for this visit. {Vitals History (Optional):23777}  Physical Exam   No results found for any visits on 08/12/23.  The ASCVD Risk score (Arnett DK, et al., 2019) failed to calculate for the following reasons:   The 2019 ASCVD risk score is only valid for ages 89 to 68    Assessment & Plan:  There are no diagnoses linked to this encounter.  No follow-ups on file.   [name], FNP

## 2023-08-11 NOTE — Patient Instructions (Signed)

## 2023-08-12 ENCOUNTER — Ambulatory Visit: Payer: Commercial Managed Care - PPO | Admitting: Family Medicine

## 2023-08-23 NOTE — Patient Instructions (Signed)
        Great to see you today.  I have refilled the medication(s) we provide.    Current approved weight loss injections include:  Wegovy   Zepbound (tirzepatide)  Saxenda (liraglutide)      If labs were collected, we will inform you of lab results once received either by echart message or telephone call.   - echart message- for normal results that have been seen by the patient already.   - telephone call: abnormal results or if patient has not viewed results in their echart.   - Please take medications as prescribed. - Follow up with your primary health provider if any health concerns arises. - If symptoms worsen please contact your primary care provider and/or visit the emergency department.

## 2023-08-23 NOTE — Progress Notes (Unsigned)
   Established Patient Office Visit   Subjective  Patient ID: Kimberly Bautista, female    DOB: [birth year]  Age: 32 y.o. MRN: [id number]  No chief complaint on file.   She  has a past medical history of DDD (degenerative disc disease), lumbar, PCOS (polycystic ovarian syndrome), and Ulcerative colitis (HCC).  HPI  ROS    Objective:     There were no vitals taken for this visit. {Vitals History (Optional):23777}  Physical Exam   No results found for any visits on 08/24/23.  The ASCVD Risk score (Arnett DK, et al., 2019) failed to calculate for the following reasons:   The 2019 ASCVD risk score is only valid for ages 40 to 38    Assessment & Plan:  There are no diagnoses linked to this encounter.  No follow-ups on file.   [name], FNP

## 2023-08-24 ENCOUNTER — Ambulatory Visit (INDEPENDENT_AMBULATORY_CARE_PROVIDER_SITE_OTHER): Payer: Commercial Managed Care - PPO | Admitting: Family Medicine

## 2023-08-24 VITALS — BP 122/82 | HR 76 | Ht 65.0 in | Wt 255.0 lb

## 2023-08-24 DIAGNOSIS — R7303 Prediabetes: Secondary | ICD-10-CM | POA: Diagnosis not present

## 2023-08-24 DIAGNOSIS — E782 Mixed hyperlipidemia: Secondary | ICD-10-CM

## 2023-08-24 DIAGNOSIS — E785 Hyperlipidemia, unspecified: Secondary | ICD-10-CM | POA: Insufficient documentation

## 2023-08-24 NOTE — Assessment & Plan Note (Signed)
Last Hemoglobin A1c: 5.9 Labs: Ordered today, results pending; will follow up accordingly. Reviewed non-pharmacological interventions, including a balanced diet rich in lean proteins, healthy fats, whole grains, and high-fiber vegetables. Emphasized reducing refined sugars and processed carbohydrates, and incorporating more fruits, leafy greens, and legumes. Patient Understanding: The patient verbalized understanding of the care plan, and all questions were answered.

## 2023-08-24 NOTE — Assessment & Plan Note (Signed)
Current BMI 42.44 Trial on Phentermine Discussed the importance to start eating 3 meals a day including breakfast, drink 8 glasses of water a day ,reduce portion sizes. reduced carbohydrates limit saturated and trans fat, increase servings of vegetables and limit processed foods. Find an activity that you will enjoy and start to be active at least 5 days a week for 30 minutes each day. Keep a food journal or an activity journal to identify triggers that lead to emotional eating

## 2023-08-24 NOTE — Assessment & Plan Note (Signed)
Repeat Lipid panel done today Recent LDL 158 Discussed lifestyle modifications and follow diet low in saturated fat. Diet to Lower Cholesterol Eat More: Oats, beans, and lentils: High in soluble fiber. Fatty fish: Salmon, tuna (rich in omega-3s). Nuts and seeds: Almonds, walnuts, flaxseeds. Fruits and vegetables: Apples, berries, leafy greens. Healthy fats: Olive oil, avocado. Limit: Saturated fats: Butter, cream, fatty meats. Trans fats: Fried foods, processed snacks. Sugar and refined carbs: Sweets, white bread. Focus on whole foods, healthy fats, and fiber to improve heart health! Maintain an exercise routine 3 to 5 days a week for a minimum total of 150 minutes.

## 2023-09-03 LAB — HEMOGLOBIN A1C
Est. average glucose Bld gHb Est-mCnc: 111 mg/dL
Hgb A1c MFr Bld: 5.5 % (ref 4.8–5.6)

## 2023-09-03 LAB — LIPID PANEL
Chol/HDL Ratio: 5.5 {ratio} — ABNORMAL HIGH (ref 0.0–4.4)
Cholesterol, Total: 220 mg/dL — ABNORMAL HIGH (ref 100–199)
HDL: 40 mg/dL (ref >39)
LDL Chol Calc (NIH): 153 mg/dL — ABNORMAL HIGH (ref 0–99)
Triglycerides: 150 mg/dL — ABNORMAL HIGH (ref 0–149)
VLDL Cholesterol Cal: 27 mg/dL (ref 5–40)

## 2023-09-18 ENCOUNTER — Ambulatory Visit
Admission: EM | Admit: 2023-09-18 | Discharge: 2023-09-18 | Disposition: A | Discharge status: Home / Self Care | Payer: Commercial Managed Care - PPO | Attending: Nurse Practitioner | Admitting: Nurse Practitioner

## 2023-09-18 ENCOUNTER — Telehealth: Payer: Self-pay

## 2023-09-18 DIAGNOSIS — J029 Acute pharyngitis, unspecified: Secondary | ICD-10-CM | POA: Diagnosis present

## 2023-09-18 DIAGNOSIS — B349 Viral infection, unspecified: Secondary | ICD-10-CM | POA: Diagnosis present

## 2023-09-18 DIAGNOSIS — H6593 Unspecified nonsuppurative otitis media, bilateral: Secondary | ICD-10-CM | POA: Diagnosis present

## 2023-09-18 LAB — POC COVID19/FLU A&B COMBO
Covid Antigen, POC: NEGATIVE
Influenza A Antigen, POC: NEGATIVE
Influenza B Antigen, POC: NEGATIVE

## 2023-09-18 LAB — POCT RAPID STREP A (OFFICE): Rapid Strep A Screen: NEGATIVE

## 2023-09-18 MED ORDER — NYSTATIN 100000 UNIT/ML MT SUSP: 10.0000 mL | Freq: Four times a day (QID) | OROMUCOSAL | 0 refills | Status: DC | PRN

## 2023-09-18 MED ORDER — FLUTICASONE PROPIONATE 50 MCG/ACT NA SUSP: 2.0000 | Freq: Every day | NASAL | 0 refills | Status: DC

## 2023-09-18 NOTE — ED Triage Notes (Signed)
Pt reports she has a sore throat, bilateral ear pain, fever, and body aches x 2 days.     Took tylenol and motrin

## 2023-09-18 NOTE — Discharge Instructions (Signed)
The rapid strep test and COVID/flu test were negative.  A throat culture is pending.  You will be contacted if the pending test result is abnormal.  You also have access to your results via MyChart. This most likely is a viral illness.  I have provided you with symptomatic treatment until symptoms improve.  Please be advised that symptoms may last anywhere from 7 to 10 days.  If your symptoms have not improved before that time, or worsening, you may follow-up in this clinic for reevaluation. May take over-the-counter ibuprofen or Tylenol as needed for pain, fever, or general discomfort. Warm salt water gargles 3-4 times daily as needed for throat pain or discomfort. Recommend use of Chloraseptic throat spray and throat lozenges for throat pain. Recommend a soft diet such as soup, broth, yogurt, pudding, Jell-O, or popsicles while symptoms persist. Follow-up as needed.

## 2023-09-18 NOTE — ED Provider Notes (Signed)
[facility]    CSN: [id number] Arrival date & time: 09/18/23  0804      History   Chief Complaint Chief Complaint  Patient presents with   Sore Throat    HPI Kimberly Bautista is a 32 y.o. female.   The history is provided by the patient.   Patient presents for complaints of fever, chills, generalized bodyaches, sore throat, and bilateral ear pain that started over the past 2 to 3 days.  Tmax 104.  Denies nasal congestion, runny nose, cough, abdominal pain, nausea, vomiting, diarrhea, or rash.  Patient reports she has been taking Tylenol, Motrin, and TheraFlu for her symptoms.  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  Past Medical History:  Diagnosis Date   DDD (degenerative disc disease), lumbar    PCOS (polycystic ovarian syndrome)    Ulcerative colitis (HCC)     Patient Active Problem List   Diagnosis Date Noted   Prediabetes 08/24/2023   Hyperlipidemia LDL goal <70 08/24/2023   Morbid obesity (HCC) 08/24/2023   Acute bacterial infection of right middle ear 07/02/2023   Insomnia 01/15/2023   Initial high blood pressure determined by examination 01/15/2023   Depression, major, single episode, mild (HCC) 01/15/2023   HNP (herniated nucleus pulposus), lumbar 01/21/2021    Past Surgical History:  Procedure Laterality Date   LUMBAR LAMINECTOMY/DECOMPRESSION MICRODISCECTOMY Right 01/21/2021   Procedure: RIGHT LUMBAR FIVE-SACRAL ONE LUMBAR LAMINECTOMY/DECOMPRESSION MICRODISCECTOMY;  Surgeon: Donalee Citrin, MD;  Location: [facility] OR;  Service: Neurosurgery;  Laterality: Right;   TONSILLECTOMY      OB History     Gravida  0   Para  0   Term  0   Preterm  0   AB  0   Living  0      SAB  0   IAB  0   Ectopic  0   Multiple  0   Live Births  0            Home Medications    Prior to Admission medications   Medication Sig Start Date End Date Taking? Authorizing Provider  fluticasone (FLONASE) 50 MCG/ACT nasal spray Place 2 sprays into both  nostrils daily. 09/18/23  Yes Leath-Warren, Sadie Haber, NP  magic mouthwash (nystatin, hydrocortisone, diphenhydrAMINE, lidocaine) suspension Swish and swallow 10 mLs 4 (four) times daily as needed for mouth pain. 09/18/23  Yes Leath-Warren, Sadie Haber, NP  albuterol (VENTOLIN HFA) 108 (90 Base) MCG/ACT inhaler Inhale 2 puffs into the lungs every 6 (six) hours as needed for wheezing or shortness of breath. 07/02/23   Del Nigel Berthold, FNP  traZODone (DESYREL) 50 MG tablet Take 0.5 tablets (25 mg total) by mouth at bedtime as needed for sleep. 01/15/23   Del Nigel Berthold, FNP    Family History Family History  Problem Relation Age of Onset   Ovarian cancer Mother    Cancer Mother        Colon and Ovarian   Hypertension Mother    Cancer Maternal Grandmother        Colon   Cancer Maternal Grandfather        Colon    Social History Social History   Tobacco Use   Smoking status: Every Day    Types: E-cigarettes   Smokeless tobacco: Never  Substance Use Topics   Alcohol use: Yes    Comment: rare   Drug use: Never     Allergies   Penicillins   Review of Systems Review  of Systems Per HPI  Physical Exam Triage Vital Signs ED Triage Vitals  Encounter Vitals Group     BP 09/18/23 0811 (!) 136/94     Systolic BP Percentile --      Diastolic BP Percentile --      Pulse Rate 09/18/23 0811 80     Resp 09/18/23 0811 18     Temp 09/18/23 0811 98.3 F (36.8 C)     Temp Source 09/18/23 0811 Oral     SpO2 09/18/23 0811 97 %     Weight --      Height --      Head Circumference --      Peak Flow --      Pain Score 09/18/23 0813 5     Pain Loc --      Pain Education --      Exclude from Growth Chart --    No data found.  Updated Vital Signs BP (!) 136/94 (BP Location: Right Arm)   Pulse 80   Temp 98.3 F (36.8 C) (Oral)   Resp 18   LMP 08/29/2023   SpO2 97%   Visual Acuity Right Eye Distance:   Left Eye Distance:   Bilateral Distance:    Right Eye  Near:   Left Eye Near:    Bilateral Near:     Physical Exam Vitals and nursing note reviewed.  Constitutional:      General: She is not in acute distress.    Appearance: Normal appearance.  HENT:     Head: Normocephalic.     Right Ear: Ear canal and external ear normal. A middle ear effusion is present.     Left Ear: Ear canal and external ear normal. A middle ear effusion is present.     Nose: Congestion present.     Right Turbinates: Enlarged and swollen.     Left Turbinates: Enlarged and swollen.     Right Sinus: No maxillary sinus tenderness or frontal sinus tenderness.     Left Sinus: No maxillary sinus tenderness or frontal sinus tenderness.     Mouth/Throat:     Lips: Pink.     Mouth: Mucous membranes are moist.     Pharynx: Posterior oropharyngeal erythema and postnasal drip present.  Eyes:     Extraocular Movements: Extraocular movements intact.     Conjunctiva/sclera: Conjunctivae normal.     Pupils: Pupils are equal, round, and reactive to light.  Cardiovascular:     Rate and Rhythm: Normal rate and regular rhythm.     Pulses: Normal pulses.     Heart sounds: Normal heart sounds.  Pulmonary:     Effort: Pulmonary effort is normal. No respiratory distress.     Breath sounds: Normal breath sounds. No stridor. No wheezing, rhonchi or rales.  Abdominal:     General: Bowel sounds are normal.     Palpations: Abdomen is soft.     Tenderness: There is no abdominal tenderness.  Musculoskeletal:     Cervical back: Normal range of motion.  Lymphadenopathy:     Cervical: No cervical adenopathy.  Skin:    General: Skin is warm and dry.  Neurological:     General: No focal deficit present.     Mental Status: She is alert and oriented to person, place, and time.  Psychiatric:        Mood and Affect: Mood normal.        Behavior: Behavior normal.      UC Treatments /  Results  Labs (all labs ordered are listed, but only abnormal results are displayed) Labs Reviewed   CULTURE, GROUP A STREP [facility])  POCT RAPID STREP A (OFFICE)  POC COVID19/FLU A&B COMBO    EKG   Radiology No results found.  Procedures Procedures (including critical care time)  Medications Ordered in UC Medications - No data to display  Initial Impression / Assessment and Plan / UC Course  I have reviewed the triage vital signs and the nursing notes.  Pertinent labs & imaging results that were available during my care of the patient were reviewed by me and considered in my medical decision making (see chart for details).  Rapid strep test and COVID/flu test were negative.  Throat culture is pending.  Suspect viral illness at this time.  Will provide symptomatic treatment with Magic mouthwash for throat pain or discomfort, and fluticasone 50 mcg nasal spray for bilateral middle ear effusions.  Supportive care recommendations were provided and discussed with the patient to include fluids, rest, over-the-counter analgesics, warm salt water gargles, and a soft diet.  Patient was indications regarding when follow-up will be indicated.  Patient was in agreement with this plan of care and verbalizes understanding.  All questions were answered.  Patient stable for discharge.  Work note was provided.  Final Clinical Impressions(s) / UC Diagnoses   Final diagnoses:  Viral illness  Sore throat  Middle ear effusion, bilateral     Discharge Instructions      The rapid strep test and COVID/flu test were negative.  A throat culture is pending.  You will be contacted if the pending test result is abnormal.  You also have access to your results via MyChart. This most likely is a viral illness.  I have provided you with symptomatic treatment until symptoms improve.  Please be advised that symptoms may last anywhere from 7 to 10 days.  If your symptoms have not improved before that time, or worsening, you may follow-up in this clinic for reevaluation. May take over-the-counter ibuprofen or  Tylenol as needed for pain, fever, or general discomfort. Warm salt water gargles 3-4 times daily as needed for throat pain or discomfort. Recommend use of Chloraseptic throat spray and throat lozenges for throat pain. Recommend a soft diet such as soup, broth, yogurt, pudding, Jell-O, or popsicles while symptoms persist. Follow-up as needed.      ED Prescriptions     Medication Sig Dispense Auth. Provider   magic mouthwash (nystatin, hydrocortisone, diphenhydrAMINE, lidocaine) suspension Swish and swallow 10 mLs 4 (four) times daily as needed for mouth pain. 540 mL Leath-Warren, Sadie Haber, NP   fluticasone (FLONASE) 50 MCG/ACT nasal spray Place 2 sprays into both nostrils daily. 16 g Leath-Warren, Sadie Haber, NP      PDMP not reviewed this encounter.   Abran Cantor, NP 09/18/23 [phone]

## 2023-09-20 ENCOUNTER — Encounter: Payer: Self-pay | Admitting: Family Medicine

## 2023-09-20 ENCOUNTER — Telehealth: Payer: Commercial Managed Care - PPO | Admitting: Family Medicine

## 2023-09-20 VITALS — BP 136/92 | HR 80 | Ht 65.0 in | Wt 250.0 lb

## 2023-09-20 DIAGNOSIS — H1033 Unspecified acute conjunctivitis, bilateral: Secondary | ICD-10-CM | POA: Diagnosis not present

## 2023-09-20 DIAGNOSIS — H109 Unspecified conjunctivitis: Secondary | ICD-10-CM | POA: Insufficient documentation

## 2023-09-20 MED ORDER — POLYMYXIN B-TRIMETHOPRIM 10000-0.1 UNIT/ML-% OP SOLN: 1.0000 [drp] | Freq: Four times a day (QID) | OPHTHALMIC | 0 refills | Status: DC

## 2023-09-20 NOTE — Progress Notes (Signed)
Virtual Visit via Video Note  I connected with Kimberly Bautista on 09/20/23 at  3:40 PM EST by a video enabled telemedicine application and verified that I am speaking with the correct person using two identifiers.  Patient Location: Home Provider Location: Office/Clinic  I discussed the limitations, risks, security, and privacy concerns of performing an evaluation and management service by video and the availability of in person appointments. I also discussed with the patient that there may be a patient responsible charge related to this service. The patient expressed understanding and agreed to proceed.  Subjective: PCP: Rica Records, FNP  Chief Complaint  Patient presents with   Acute Visit    Pt. Feels like she has infection in her left eye. She has a red swollen eye that has yellow mucus draining out of eye. Woke up w/ eye sealed shut from drainage. No pain or itching reported.    [name] 32 year old female, presents via telehealth, patient reports blurred vision in the left eye accompanied by yellow discharge, causing the eye to become matted. Symptoms of conjunctivitis began yesterday and have remained unchanged since onset. Associated symptoms include nasal congestion, rhinorrhea, sore throat, eye discharge, and redness of the affected eye. Notably, the patient denies having a fever. Eye pain is described as mild.     ROS: Per HPI  Current Outpatient Medications:    albuterol (VENTOLIN HFA) 108 (90 Base) MCG/ACT inhaler, Inhale 2 puffs into the lungs every 6 (six) hours as needed for wheezing or shortness of breath., Disp: 8 g, Rfl: 2   cetirizine (ZYRTEC) 10 MG tablet, Take 10 mg by mouth daily., Disp: , Rfl:    fluticasone (FLONASE) 50 MCG/ACT nasal spray, Place 2 sprays into both nostrils daily., Disp: 16 g, Rfl: 0   ipratropium (ATROVENT) 0.03 % nasal spray, Place 2 sprays into both nostrils 3 (three) times daily., Disp: , Rfl:    magic mouthwash  (nystatin, hydrocortisone, diphenhydrAMINE, lidocaine) suspension, Swish and swallow 10 mLs 4 (four) times daily as needed for mouth pain., Disp: 540 mL, Rfl: 0   traZODone (DESYREL) 50 MG tablet, Take 0.5 tablets (25 mg total) by mouth at bedtime as needed for sleep., Disp: 30 tablet, Rfl: 3   trimethoprim-polymyxin b (POLYTRIM) ophthalmic solution, Place 1 drop into both eyes every 6 (six) hours. For 5 days, Disp: 10 mL, Rfl: 0  Observations/Objective: Today's Vitals   09/20/23 1547  BP: (!) 136/92  Pulse: 80  Weight: 250 lb (113.4 kg)  Height: 5\' 5"  (1.651 m)  PainSc: 0-No pain   Physical Exam Patient is alert and no acute distress noted.   Assessment and Plan: Acute bacterial conjunctivitis of both eyes -     Polymyxin B-Trimethoprim; Place 1 drop into both eyes every 6 (six) hours. For 5 days  Dispense: 10 mL; Refill: 0  Bacterial conjunctivitis Assessment & Plan: Trimethoprim-polymyxin B drops every 6 hours x 5 days Discussed handwashing to limit spread to others, decrease rubbing of eye, careful flushing of eyes with saline, warm compress to the affected eye 3 times daily.     Follow Up Instructions: No follow-ups on file.   I discussed the assessment and treatment plan with the patient. The patient was provided an opportunity to ask questions, and all were answered. The patient agreed with the plan and demonstrated an understanding of the instructions.   The patient was advised to call back or seek an in-person evaluation if the symptoms worsen or if  the condition fails to improve as anticipated.  The above assessment and management plan was discussed with the patient. The patient verbalized understanding of and has agreed to the management plan.   Kimberly Lederer Newman Nip, FNP

## 2023-09-20 NOTE — Assessment & Plan Note (Signed)
Trimethoprim-polymyxin B drops every 6 hours x 5 days Discussed handwashing to limit spread to others, decrease rubbing of eye, careful flushing of eyes with saline, warm compress to the affected eye 3 times daily.

## 2023-09-21 LAB — CULTURE, GROUP A STREP (THRC)

## 2023-10-08 ENCOUNTER — Telehealth: Payer: Commercial Managed Care - PPO | Admitting: Family Medicine

## 2023-10-08 DIAGNOSIS — J111 Influenza due to unidentified influenza virus with other respiratory manifestations: Secondary | ICD-10-CM

## 2023-10-08 MED ORDER — PROMETHAZINE-DM 6.25-15 MG/5ML PO SYRP: 5.0000 mL | ORAL_SOLUTION | Freq: Four times a day (QID) | ORAL | 0 refills | Status: AC | PRN

## 2023-10-08 MED ORDER — OSELTAMIVIR PHOSPHATE 75 MG PO CAPS: 75.0000 mg | ORAL_CAPSULE | Freq: Two times a day (BID) | ORAL | 0 refills | Status: AC

## 2023-10-08 NOTE — Progress Notes (Signed)
 Virtual Visit Consent   Kimberly Bautista, you are scheduled for a virtual visit with a [facility] provider today. Just as with appointments in the office, your consent must be obtained to participate. Your consent will be active for this visit and any virtual visit you may have with one of our providers in the next 365 days. If you have a MyChart account, a copy of this consent can be sent to you electronically.  As this is a virtual visit, video technology does not allow for your provider to perform a traditional examination. This may limit your provider's ability to fully assess your condition. If your provider identifies any concerns that need to be evaluated in person or the need to arrange testing (such as labs, EKG, etc.), we will make arrangements to do so. Although advances in technology are sophisticated, we cannot ensure that it will always work on either your end or our end. If the connection with a video visit is poor, the visit may have to be switched to a telephone visit. With either a video or telephone visit, we are not always able to ensure that we have a secure connection.  By engaging in this virtual visit, you consent to the provision of healthcare and authorize for your insurance to be billed (if applicable) for the services provided during this visit. Depending on your insurance coverage, you may receive a charge related to this service.  I need to obtain your verbal consent now. Are you willing to proceed with your visit today? Kimberly Bautista has provided verbal consent on 10/08/2023 for a virtual visit (video or telephone). Kimberly Lamp, FNP  Date: 10/08/2023 8:04 AM  Virtual Visit via Video Note   I, Kimberly Bautista, connected with  Kimberly Bautista  ([id number], 1991/01/10) on 10/08/23 at  8:00 AM EST by a video-enabled telemedicine application and verified that I am speaking with the correct person using two identifiers.  Location: Patient: Virtual Visit Location Patient:  Home Provider: Virtual Visit Location Provider: Home Office   I discussed the limitations of evaluation and management by telemedicine and the availability of in person appointments. The patient expressed understanding and agreed to proceed.    History of Present Illness: Kimberly Bautista is a 33 y.o. who identifies as a female who was assigned female at birth, and is being seen today for fever, body aches, chills, cough, sinus pressure, congestion starting yesterday. 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   HPI: HPI  Problems:  Patient Active Problem List   Diagnosis Date Noted   Bacterial conjunctivitis 09/20/2023   Prediabetes 08/24/2023   Hyperlipidemia LDL goal <70 08/24/2023   Morbid obesity (HCC) 08/24/2023   Acute bacterial infection of right middle ear 07/02/2023   Insomnia 01/15/2023   Initial high blood pressure determined by examination 01/15/2023   Depression, major, single episode, mild (HCC) 01/15/2023   HNP (herniated nucleus pulposus), lumbar 01/21/2021    Allergies:  Allergies  Allergen Reactions   Penicillins Swelling   Medications:  Current Outpatient Medications:    oseltamivir  (TAMIFLU ) 75 MG capsule, Take 1 capsule (75 mg total) by mouth 2 (two) times daily for 5 days., Disp: 10 capsule, Rfl: 0   promethazine -dextromethorphan (PROMETHAZINE -DM) 6.25-15 MG/5ML syrup, Take 5 mLs by mouth 4 (four) times daily as needed for up to 10 days for cough., Disp: 118 mL, Rfl: 0   albuterol  (VENTOLIN  HFA) 108 (90 Base) MCG/ACT inhaler, Inhale 2 puffs into the lungs every 6 (six) hours as needed for  wheezing or shortness of breath., Disp: 8 g, Rfl: 2   cetirizine (ZYRTEC) 10 MG tablet, Take 10 mg by mouth daily., Disp: , Rfl:    fluticasone  (FLONASE ) 50 MCG/ACT nasal spray, Place 2 sprays into both nostrils daily., Disp: 16 g, Rfl: 0   ipratropium (ATROVENT) 0.03 % nasal spray, Place 2 sprays into both nostrils 3 (three) times daily., Disp: , Rfl:    magic mouthwash  (nystatin , hydrocortisone, diphenhydrAMINE, lidocaine ) suspension, Swish and swallow 10 mLs 4 (four) times daily as needed for mouth pain., Disp: 540 mL, Rfl: 0   traZODone  (DESYREL ) 50 MG tablet, Take 0.5 tablets (25 mg total) by mouth at bedtime as needed for sleep., Disp: 30 tablet, Rfl: 3   trimethoprim -polymyxin b  (POLYTRIM ) ophthalmic solution, Place 1 drop into both eyes every 6 (six) hours. For 5 days, Disp: 10 mL, Rfl: 0  Observations/Objective: Patient is well-developed, well-nourished in no acute distress.  Resting comfortably  at home.  Head is normocephalic, atraumatic.  No labored breathing.  Speech is clear and coherent with logical content.  Patient is alert and oriented at baseline.    Assessment and Plan: 1. Influenza-like illness (Primary)  Increase fluids, humidifier at night, tylenol  or ibuprofen as directed, mucinex, rest, UC if sx worsen.   Follow Up Instructions: I discussed the assessment and treatment plan with the patient. The patient was provided an opportunity to ask questions and all were answered. The patient agreed with the plan and demonstrated an understanding of the instructions.  A copy of instructions were sent to the patient via MyChart unless otherwise noted below.     The patient was advised to call back or seek an in-person evaluation if the symptoms worsen or if the condition fails to improve as anticipated.    Kimberly Arwood, FNP

## 2023-10-08 NOTE — Patient Instructions (Signed)

## 2023-11-23 NOTE — Progress Notes (Unsigned)
   Established Patient Office Visit   Subjective  Patient ID: Kimberly Bautista, female    DOB: 06-09-1991  Age: 33 y.o. MRN: [id number]  No chief complaint on file.   She  has a past medical history of DDD (degenerative disc disease), lumbar, PCOS (polycystic ovarian syndrome), and Ulcerative colitis (HCC).  HPI  ROS    Objective:     There were no vitals taken for this visit. {Vitals History (Optional):23777}  Physical Exam   No results found for any visits on 11/24/23.  The ASCVD Risk score (Arnett DK, et al., 2019) failed to calculate for the following reasons:   The 2019 ASCVD risk score is only valid for ages 106 to 64    Assessment & Plan:  There are no diagnoses linked to this encounter.  No follow-ups on file.   [name], FNP

## 2023-11-23 NOTE — Patient Instructions (Signed)

## 2023-11-24 ENCOUNTER — Encounter: Payer: Self-pay | Admitting: Family Medicine

## 2023-11-24 ENCOUNTER — Ambulatory Visit (INDEPENDENT_AMBULATORY_CARE_PROVIDER_SITE_OTHER): Payer: Commercial Managed Care - PPO | Admitting: Family Medicine

## 2023-11-24 VITALS — BP 122/88 | HR 72 | Ht 65.0 in | Wt 251.1 lb

## 2023-11-24 DIAGNOSIS — Z6841 Body Mass Index (BMI) 40.0 and over, adult: Secondary | ICD-10-CM

## 2023-11-24 DIAGNOSIS — L918 Other hypertrophic disorders of the skin: Secondary | ICD-10-CM

## 2023-11-24 MED ORDER — PHENTERMINE HCL 37.5 MG PO TABS: 37.5000 mg | ORAL_TABLET | Freq: Every day | ORAL | 2 refills | Status: DC

## 2023-11-24 NOTE — Assessment & Plan Note (Signed)
 Trial on Phentermine  Discussed Eat a Balanced Diet: Focus on whole, nutrient-dense foods like lean proteins, vegetables, fruits, whole grains, and healthy fats while avoiding processed and sugary foods. Stay Active: Incorporate at least 30 minutes of moderate physical activity most days of the week, such as walking, jogging, or strength training. Hydrate and Rest: Drink plenty of water throughout the day and ensure you get 7-9 hours of quality sleep each night to support metabolism and recovery. Practice Portion Control: Use smaller plates, measure portions, and eat mindfully to avoid overeating and manage calorie intake effectively.

## 2024-02-17 ENCOUNTER — Other Ambulatory Visit: Payer: Self-pay | Admitting: Family Medicine

## 2024-02-17 DIAGNOSIS — F5101 Primary insomnia: Secondary | ICD-10-CM

## 2024-02-24 ENCOUNTER — Ambulatory Visit (INDEPENDENT_AMBULATORY_CARE_PROVIDER_SITE_OTHER): Payer: Commercial Managed Care - PPO | Admitting: Family Medicine

## 2024-02-24 VITALS — BP 110/85 | HR 79 | Ht 65.0 in | Wt 201.8 lb

## 2024-02-24 DIAGNOSIS — E559 Vitamin D deficiency, unspecified: Secondary | ICD-10-CM

## 2024-02-24 DIAGNOSIS — E782 Mixed hyperlipidemia: Secondary | ICD-10-CM

## 2024-02-24 DIAGNOSIS — E6609 Other obesity due to excess calories: Secondary | ICD-10-CM

## 2024-02-24 DIAGNOSIS — R7301 Impaired fasting glucose: Secondary | ICD-10-CM

## 2024-02-24 DIAGNOSIS — E038 Other specified hypothyroidism: Secondary | ICD-10-CM

## 2024-02-24 DIAGNOSIS — E66811 Obesity, class 1: Secondary | ICD-10-CM | POA: Insufficient documentation

## 2024-02-24 DIAGNOSIS — Z6833 Body mass index (BMI) 33.0-33.9, adult: Secondary | ICD-10-CM

## 2024-02-24 MED ORDER — PHENTERMINE HCL 37.5 MG PO TABS: 37.5000 mg | ORAL_TABLET | Freq: Every day | ORAL | 2 refills | Status: AC

## 2024-02-24 NOTE — Assessment & Plan Note (Addendum)
 Labs ordered Patient loss 50 lbs since out last visit Refilled Phentermine  Discussed Eat a Balanced Diet: Focus on whole, nutrient-dense foods like lean proteins, vegetables, fruits, whole grains, and healthy fats while avoiding processed and sugary foods. Stay Active: Incorporate at least 30 minutes of moderate physical activity most days of the week, such as walking, jogging, or strength training. Hydrate and Rest: Drink plenty of water throughout the day and ensure you get 7-9 hours of quality sleep each night to support metabolism and recovery. Practice Portion Control: Use smaller plates, measure portions, and eat mindfully to avoid overeating and manage calorie intake effectively.

## 2024-02-24 NOTE — Patient Instructions (Signed)

## 2024-02-24 NOTE — Progress Notes (Signed)
 Established Patient Office Visit   Subjective  Patient ID: Kimberly Bautista, female    DOB: 26-Jun-1991  Age: 33 y.o. MRN: [id number]  Chief Complaint  Patient presents with   Follow-up    Weight Management     She  has a past medical history of DDD (degenerative disc disease), lumbar, PCOS (polycystic ovarian syndrome), and Ulcerative colitis (HCC).  HPI Patient presents to the clinic for weight loss follow up. For the details of today's visit, please refer to assessment and plan.   Review of Systems  Constitutional:  Negative for chills and fever.  Eyes:  Negative for blurred vision.  Respiratory:  Negative for shortness of breath.   Cardiovascular:  Negative for chest pain.  Neurological:  Negative for dizziness and headaches.      Objective:     BP 110/85   Pulse 79   Ht 5\' 5"  (1.651 m)   Wt 201 lb 12 oz (91.5 kg)   SpO2 98%   BMI 33.57 kg/m  BP Readings from Last 3 Encounters:  02/24/24 110/85  11/24/23 122/88  09/20/23 (!) 136/92      Physical Exam Vitals reviewed.  Constitutional:      General: She is not in acute distress.    Appearance: Normal appearance. She is not ill-appearing, toxic-appearing or diaphoretic.  HENT:     Head: Normocephalic.  Eyes:     General:        Right eye: No discharge.        Left eye: No discharge.     Conjunctiva/sclera: Conjunctivae normal.  Cardiovascular:     Rate and Rhythm: Normal rate.     Pulses: Normal pulses.     Heart sounds: Normal heart sounds.  Pulmonary:     Effort: Pulmonary effort is normal. No respiratory distress.     Breath sounds: Normal breath sounds.  Abdominal:     General: Bowel sounds are normal.     Palpations: Abdomen is soft.     Tenderness: There is no abdominal tenderness. There is no guarding.  Musculoskeletal:        General: Normal range of motion.  Skin:    General: Skin is warm and dry.     Capillary Refill: Capillary refill takes less than 2 seconds.  Neurological:      General: No focal deficit present.     Mental Status: She is alert and oriented to person, place, and time.     Coordination: Coordination normal.     Gait: Gait normal.  Psychiatric:        Mood and Affect: Mood normal.        Behavior: Behavior normal.      No results found for any visits on 02/24/24.  The ASCVD Risk score (Arnett DK, et al., 2019) failed to calculate for the following reasons:   The 2019 ASCVD risk score is only valid for ages 67 to 73    Assessment & Plan:  Class 1 obesity due to excess calories with serious comorbidity and body mass index (BMI) of 33.0 to 33.9 in adult Assessment & Plan: Labs ordered Patient loss 50 lbs since out last visit Refilled Phentermine  Discussed Eat a Balanced Diet: Focus on whole, nutrient-dense foods like lean proteins, vegetables, fruits, whole grains, and healthy fats while avoiding processed and sugary foods. Stay Active: Incorporate at least 30 minutes of moderate physical activity most days of the week, such as walking, jogging, or strength training. Hydrate and Rest: Drink plenty  of water throughout the day and ensure you get 7-9 hours of quality sleep each night to support metabolism and recovery. Practice Portion Control: Use smaller plates, measure portions, and eat mindfully to avoid overeating and manage calorie intake effectively.   Orders: -     Phentermine  HCl; Take 1 tablet (37.5 mg total) by mouth daily before breakfast.  Dispense: 30 tablet; Refill: 2  Mixed hyperlipidemia -     BMP8+eGFR -     Lipid panel -     CBC with Differential/Platelet  TSH (thyroid-stimulating hormone deficiency) -     TSH + free T4  IFG (impaired fasting glucose) -     Hemoglobin A1c  Vitamin D  deficiency -     VITAMIN D  25 Hydroxy (Vit-D Deficiency, Fractures)    Return in about 4 months (around 06/26/2024), or if symptoms worsen or fail to improve, for Weight Loss Mangment.   Kimberly Lek Amber Bail, FNP

## 2024-02-25 ENCOUNTER — Ambulatory Visit: Payer: Self-pay | Admitting: Family Medicine

## 2024-02-25 LAB — CBC WITH DIFFERENTIAL/PLATELET
Basophils Absolute: 0.1 10*3/uL (ref 0.0–0.2)
Basos: 1 %
EOS (ABSOLUTE): 0.2 10*3/uL (ref 0.0–0.4)
Eos: 3 %
Hematocrit: 43.9 % (ref 34.0–46.6)
Hemoglobin: 14.8 g/dL (ref 11.1–15.9)
Immature Grans (Abs): 0 10*3/uL (ref 0.0–0.1)
Immature Granulocytes: 0 %
Lymphocytes Absolute: 2.6 10*3/uL (ref 0.7–3.1)
Lymphs: 37 %
MCH: 30.1 pg (ref 26.6–33.0)
MCHC: 33.7 g/dL (ref 31.5–35.7)
MCV: 89 fL (ref 79–97)
Monocytes Absolute: 0.4 10*3/uL (ref 0.1–0.9)
Monocytes: 6 %
Neutrophils Absolute: 3.7 10*3/uL (ref 1.4–7.0)
Neutrophils: 53 %
Platelets: 288 10*3/uL (ref 150–450)
RBC: 4.91 x10E6/uL (ref 3.77–5.28)
RDW: 12.8 % (ref 11.7–15.4)
WBC: 7 10*3/uL (ref 3.4–10.8)

## 2024-02-25 LAB — BMP8+EGFR
BUN/Creatinine Ratio: 6 — ABNORMAL LOW (ref 9–23)
BUN: 5 mg/dL — ABNORMAL LOW (ref 6–20)
CO2: 20 mmol/L (ref 20–29)
Calcium: 9.3 mg/dL (ref 8.7–10.2)
Chloride: 100 mmol/L (ref 96–106)
Creatinine, Ser: 0.85 mg/dL (ref 0.57–1.00)
Glucose: 88 mg/dL (ref 70–99)
Potassium: 3.4 mmol/L — ABNORMAL LOW (ref 3.5–5.2)
Sodium: 141 mmol/L (ref 134–144)
eGFR: 93 mL/min/{1.73_m2} (ref >59)

## 2024-02-25 LAB — LIPID PANEL
Chol/HDL Ratio: 4.7 ratio — ABNORMAL HIGH (ref 0.0–4.4)
Cholesterol, Total: 194 mg/dL (ref 100–199)
HDL: 41 mg/dL (ref >39)
LDL Chol Calc (NIH): 132 mg/dL — ABNORMAL HIGH (ref 0–99)
Triglycerides: 117 mg/dL (ref 0–149)
VLDL Cholesterol Cal: 21 mg/dL (ref 5–40)

## 2024-02-25 LAB — HEMOGLOBIN A1C
Est. average glucose Bld gHb Est-mCnc: 97 mg/dL
Hgb A1c MFr Bld: 5 % (ref 4.8–5.6)

## 2024-02-25 LAB — TSH+FREE T4
Free T4: 1.1 ng/dL (ref 0.82–1.77)
TSH: 2.22 u[IU]/mL (ref 0.450–4.500)

## 2024-02-25 LAB — VITAMIN D 25 HYDROXY (VIT D DEFICIENCY, FRACTURES): Vit D, 25-Hydroxy: 45.1 ng/mL (ref 30.0–100.0)

## 2024-05-07 ENCOUNTER — Other Ambulatory Visit: Payer: Self-pay | Admitting: Family Medicine

## 2024-05-07 DIAGNOSIS — F5101 Primary insomnia: Secondary | ICD-10-CM

## 2024-06-27 ENCOUNTER — Ambulatory Visit

## 2024-08-08 ENCOUNTER — Ambulatory Visit: Admitting: Dermatology
